# Patient Record
Sex: Female | Born: 1980 | Race: Black or African American | Hispanic: No | Marital: Single | Smoking: Never smoker
Health system: Southern US, Community
[De-identification: ages and names within clinical notes are randomized; demographics above are authoritative.]

## PROBLEM LIST (undated history)

## (undated) ENCOUNTER — Inpatient Hospital Stay: Payer: Self-pay

## (undated) DIAGNOSIS — R519 Headache, unspecified: Secondary | ICD-10-CM

## (undated) DIAGNOSIS — D649 Anemia, unspecified: Secondary | ICD-10-CM

## (undated) DIAGNOSIS — G35 Multiple sclerosis: Secondary | ICD-10-CM

## (undated) DIAGNOSIS — Z98891 History of uterine scar from previous surgery: Secondary | ICD-10-CM

## (undated) HISTORY — DX: History of uterine scar from previous surgery: Z98.891

## (undated) HISTORY — PX: LAPAROSCOPY ABDOMEN DIAGNOSTIC: PRO50

---

## 2005-09-26 ENCOUNTER — Emergency Department: Payer: Self-pay | Admitting: Emergency Medicine

## 2007-04-15 ENCOUNTER — Inpatient Hospital Stay: Payer: Self-pay | Admitting: Obstetrics and Gynecology

## 2008-12-28 ENCOUNTER — Encounter: Payer: Self-pay | Admitting: Obstetrics and Gynecology

## 2009-06-05 DIAGNOSIS — G35 Multiple sclerosis: Secondary | ICD-10-CM

## 2009-06-05 HISTORY — DX: Multiple sclerosis: G35

## 2009-07-02 ENCOUNTER — Ambulatory Visit: Payer: Self-pay | Admitting: Obstetrics and Gynecology

## 2009-07-05 ENCOUNTER — Inpatient Hospital Stay: Payer: Self-pay | Admitting: Obstetrics and Gynecology

## 2009-11-25 ENCOUNTER — Emergency Department: Payer: Self-pay | Admitting: Emergency Medicine

## 2010-08-07 ENCOUNTER — Ambulatory Visit: Payer: Self-pay | Admitting: Neurology

## 2010-08-09 ENCOUNTER — Ambulatory Visit: Payer: Self-pay | Admitting: Neurology

## 2010-09-02 ENCOUNTER — Other Ambulatory Visit: Payer: Self-pay | Admitting: Neurology

## 2012-02-29 ENCOUNTER — Emergency Department: Payer: Self-pay | Admitting: Unknown Physician Specialty

## 2012-02-29 LAB — URINALYSIS, COMPLETE
Bilirubin,UR: NEGATIVE
Blood: NEGATIVE
Glucose,UR: NEGATIVE mg/dL (ref 0–75)
Ketone: NEGATIVE
Leukocyte Esterase: NEGATIVE
Ph: 6 (ref 4.5–8.0)
Protein: NEGATIVE
Specific Gravity: 1.014 (ref 1.003–1.030)
Squamous Epithelial: 9

## 2012-02-29 LAB — CBC WITH DIFFERENTIAL/PLATELET
Basophil %: 0.5 %
Eosinophil #: 0.1 10*3/uL (ref 0.0–0.7)
Eosinophil %: 0.8 %
HGB: 9.9 g/dL — ABNORMAL LOW (ref 12.0–16.0)
Lymphocyte %: 20.4 %
MCHC: 31.4 g/dL — ABNORMAL LOW (ref 32.0–36.0)
Monocyte #: 0.5 x10 3/mm (ref 0.2–0.9)
Monocyte %: 7.2 %
Neutrophil %: 71.1 %
RBC: 4.27 10*6/uL (ref 3.80–5.20)
WBC: 7.1 10*3/uL (ref 3.6–11.0)

## 2012-02-29 LAB — COMPREHENSIVE METABOLIC PANEL
Alkaline Phosphatase: 70 U/L (ref 50–136)
Calcium, Total: 8.4 mg/dL — ABNORMAL LOW (ref 8.5–10.1)
Chloride: 106 mmol/L (ref 98–107)
Co2: 25 mmol/L (ref 21–32)
EGFR (African American): >60
EGFR (Non-African Amer.): >60
Glucose: 98 mg/dL (ref 65–99)
Osmolality: 275 (ref 275–301)
SGOT(AST): 17 U/L (ref 15–37)
SGPT (ALT): 13 U/L (ref 12–78)
Sodium: 139 mmol/L (ref 136–145)

## 2012-02-29 LAB — PREGNANCY, URINE: Pregnancy Test, Urine: NEGATIVE m[IU]/mL

## 2012-02-29 LAB — LIPASE, BLOOD: Lipase: 73 U/L (ref 73–393)

## 2012-05-09 ENCOUNTER — Ambulatory Visit: Payer: Self-pay | Admitting: Unknown Physician Specialty

## 2012-05-09 LAB — URINALYSIS, COMPLETE
Bilirubin,UR: NEGATIVE
Glucose,UR: NEGATIVE mg/dL (ref 0–75)
Ketone: NEGATIVE
Ph: 7 (ref 4.5–8.0)
Protein: NEGATIVE
RBC,UR: 6 /HPF (ref 0–5)
Specific Gravity: 1.025 (ref 1.003–1.030)
Squamous Epithelial: 3
WBC UR: 2 /HPF (ref 0–5)

## 2012-05-09 LAB — HEMOGLOBIN: HGB: 9.1 g/dL — ABNORMAL LOW (ref 12.0–16.0)

## 2012-05-10 ENCOUNTER — Ambulatory Visit: Payer: Self-pay | Admitting: Obstetrics and Gynecology

## 2012-05-13 LAB — PATHOLOGY REPORT

## 2014-09-22 NOTE — Op Note (Signed)
PATIENT NAME:  [name], [name] MR#:  [id number] DATE OF BIRTH:  1981/04/20  DATE OF PROCEDURE:  05/10/2012  PREOPERATIVE DIAGNOSIS: Pelvic pain.   POSTOPERATIVE DIAGNOSES: Pelvic pain, adhesive disease, and right paratubal cyst.   PROCEDURE:  1. Diagnostic laparoscopy.  2. Moderate adhesiolysis.  3. Excision of right paratubal cyst.  SURGEON:  Patty Sermons, M.D.   ANESTHESIA: General endotracheal.   FINDINGS: Adhesions along the anterior bladder flap at the right colonic reflection. Right-sided paratubal cyst. Grossly normal ovaries except for polycystic appearance of the right more so than the left.  Unremarkable posterior cul-de-sac, unremarkable uterus, unremarkable lower liver edge.   ESTIMATED BLOOD LOSS: Minimal.   COMPLICATIONS: None.   SPECIMENS: Right paratubal cyst.   PROCEDURE IN DETAIL: The patient was consented. Preoperative antibiotics were given. She was taken to the operating room and placed in the supine position where anesthesia was initiated. She was then placed in the dorsal lithotomy position using Allen stirrups, prepped and draped in the usual sterile fashion. Cervix was visualized. A single-tooth tenaculum was placed. IUD was in place, therefore did not place any manipulator through the cervix. The bladder was drained of approximately 50 mL of clear urine. Next we turned our attention to the abdomen.   A #11 port was placed infraumbilically under direct visualization. Five ports were placed in bilateral lower quadrants with findings as noted above. After placing the patient in the Trendelenburg position, I began to dissect free the adhesions in the anterior cul-de-sac using the Harmonic scalpel. There was a large omental adhesion in the mid lower abdomen that we dissected free initially. After dissecting the omental adhesions we began to dissect the anterior cul-de-sac adhesions, taking care to keep the dissection superficial and out of the bladder.   The right  paratubal cyst was noted and using the Harmonic was entered with spillage of clear fluid. The sac was then dissected free using blunt and Harmonic dissection. It did not appear that the tubal lumen was impacted.   I then inspected the appendix, which appeared to be unremarkable. There was an adhesion at the right colonic reflection,  which was dissected free with the Harmonic scalpel.   Pressure was lowered to 6 mmHg. Areas were irrigated and seen to be hemostatic. Pressure was brought back up and we put Interceed across the anterior cul-de-sac to attempt to inhibit adhesions recurring. At this point, the procedure was felt to have achieved maximum efficacy. Ports were removed. Pneumoperitoneum was allowed to resolve. Incisions were closed with deep of zero, subcutaneous with 3-0 Vicryl. Steri-Strips and Band-Aids were applied.   All instrument, needle, and sponge counts were correct. The patient tolerated the procedure well. 2 grams of Ancef were given IV preoperatively. I anticipate a routine postoperative course.   ____________________________ Rockey Situ. Amalia Hailey, MD rle:bjt D: 05/10/2012 09:58:45 ET T: 05/10/2012 10:47:02 ET JOB#: [id number]  cc: Audry Pili L. Amalia Hailey, MD, <Dictator> Selmer Dominion MD ELECTRONICALLY SIGNED 05/10/2012 11:36

## 2015-12-15 ENCOUNTER — Ambulatory Visit
Admission: RE | Admit: 2015-12-15 | Discharge: 2015-12-15 | Disposition: A | Discharge status: Home / Self Care | Payer: Managed Care, Other (non HMO) | Source: Ambulatory Visit | Attending: Obstetrics and Gynecology | Admitting: Obstetrics and Gynecology

## 2015-12-15 ENCOUNTER — Other Ambulatory Visit: Payer: Self-pay | Admitting: Obstetrics and Gynecology

## 2015-12-15 DIAGNOSIS — O200 Threatened abortion: Secondary | ICD-10-CM | POA: Insufficient documentation

## 2016-03-28 ENCOUNTER — Other Ambulatory Visit: Payer: Self-pay | Admitting: Obstetrics and Gynecology

## 2016-03-28 DIAGNOSIS — Z369 Encounter for antenatal screening, unspecified: Secondary | ICD-10-CM

## 2016-04-13 ENCOUNTER — Ambulatory Visit (HOSPITAL_BASED_OUTPATIENT_CLINIC_OR_DEPARTMENT_OTHER)
Admission: RE | Admit: 2016-04-13 | Discharge: 2016-04-13 | Disposition: A | Discharge status: Home / Self Care | Payer: Managed Care, Other (non HMO) | Source: Ambulatory Visit | Attending: Maternal & Fetal Medicine | Admitting: Maternal & Fetal Medicine

## 2016-04-13 ENCOUNTER — Ambulatory Visit
Admission: RE | Admit: 2016-04-13 | Discharge: 2016-04-13 | Disposition: A | Discharge status: Home / Self Care | Payer: Managed Care, Other (non HMO) | Source: Ambulatory Visit | Attending: Maternal & Fetal Medicine | Admitting: Maternal & Fetal Medicine

## 2016-04-13 VITALS — BP 134/65 | HR 82 | Temp 98.4°F | Resp 18 | Wt 176.8 lb

## 2016-04-13 DIAGNOSIS — D259 Leiomyoma of uterus, unspecified: Secondary | ICD-10-CM | POA: Insufficient documentation

## 2016-04-13 DIAGNOSIS — Z369 Encounter for antenatal screening, unspecified: Secondary | ICD-10-CM | POA: Diagnosis not present

## 2016-04-13 DIAGNOSIS — Z3A13 13 weeks gestation of pregnancy: Secondary | ICD-10-CM | POA: Insufficient documentation

## 2016-04-13 DIAGNOSIS — O208 Other hemorrhage in early pregnancy: Secondary | ICD-10-CM | POA: Insufficient documentation

## 2016-04-13 HISTORY — DX: Multiple sclerosis: G35

## 2016-04-13 NOTE — Progress Notes (Addendum)
Referring physician:  [facility] Length of Consultation: 30 minutes   [name]  was referred to [facility] for genetic counseling to review prenatal screening and testing options.  This note summarizes the information we discussed.    We offered the following routine screening tests for this pregnancy:  First trimester screening, which includes nuchal translucency ultrasound screen and first trimester maternal serum marker screening.  The nuchal translucency has approximately an 80% detection rate for Down syndrome and can be positive for other chromosome abnormalities as well as congenital heart defects.  When combined with a maternal serum marker screening, the detection rate is up to 90% for Down syndrome and up to 97% for trisomy 18.     Maternal serum marker screening, a blood test that measures pregnancy proteins, can provide risk assessments for Down syndrome, trisomy 18, and open neural tube defects (spina bifida, anencephaly). Because it does not directly examine the fetus, it cannot positively diagnose or rule out these problems.  Targeted ultrasound uses high frequency sound waves to create an image of the developing fetus.  An ultrasound is often recommended as a routine means of evaluating the pregnancy.  It is also used to screen for fetal anatomy problems (for example, a heart defect) that might be suggestive of a chromosomal or other abnormality.   Should these screening tests indicate an increased concern, then the following additional testing options would be offered:  The chorionic villus sampling procedure is available for first trimester chromosome analysis.  This involves the withdrawal of a small amount of chorionic villi (tissue from the developing placenta).  Risk of pregnancy loss is estimated to be approximately 1 in 200 to 1 in 100 (0.5 to 1%).  There is approximately a 1% (1 in 100) chance that the CVS chromosome results will be  unclear.  Chorionic villi cannot be tested for neural tube defects.     Amniocentesis involves the removal of a small amount of amniotic fluid from the sac surrounding the fetus with the use of a thin needle inserted through the maternal abdomen and uterus.  Ultrasound guidance is used throughout the procedure.  Fetal cells from amniotic fluid are directly evaluated and > 99.5% of chromosome problems and > 98% of open neural tube defects can be detected. This procedure is generally performed after the 15th week of pregnancy.  The main risks to this procedure include complications leading to miscarriage in less than 1 in 200 cases (0.5%).  As another option for information if the pregnancy is suspected to be an an increased chance for certain chromosome conditions, we also reviewed the availability of cell free fetal DNA testing from maternal blood to determine whether or not the baby may have either Down syndrome, trisomy 49, or trisomy 31.  This test utilizes a maternal blood sample and DNA sequencing technology to isolate circulating cell free fetal DNA from maternal plasma.  The fetal DNA can then be analyzed for DNA sequences that are derived from the three most common chromosomes involved in aneuploidy, chromosomes 13, 18, and 21.  If the overall amount of DNA is greater than the expected level for any of these chromosomes, aneuploidy is suspected.  While we do not consider it a replacement for invasive testing and karyotype analysis, a negative result from this testing would be reassuring, though not a guarantee of a normal chromosome complement for the baby.  An abnormal result is certainly suggestive of an abnormal chromosome complement,  though we would still recommend CVS or amniocentesis to confirm any findings from this testing.   Cystic Fibrosis and Spinal Muscular Atrophy (SMA) screening were also discussed with the patient. Both conditions are recessive, which means that both parents must be  carriers in order to have a child with the disease.  Cystic fibrosis (CF) is one of the most common genetic conditions in persons of Caucasian ancestry.  This condition occurs in approximately 1 in 2,500 Caucasian persons and results in thickened secretions in the lungs, digestive, and reproductive systems.  For a baby to be at risk for having CF, both of the parents must be carriers for this condition.  Approximately 1 in 2 Caucasian persons is a carrier for CF.  Current carrier testing looks for the most common mutations in the gene for CF and can detect approximately 90% of carriers in the Caucasian population.  This means that the carrier screening can greatly reduce, but cannot eliminate, the chance for an individual to have a child with CF.  If an individual is found to be a carrier for CF, then carrier testing would be available for the partner. As part of [facility] newborn screening profile, all babies born in the state of [place] will have a two-tier screening process.  Specimens are first tested to determine the concentration of immunoreactive trypsinogen (IRT).  The top 5% of specimens with the highest IRT values then undergo DNA testing using a panel of over 40 common CF mutations. SMA is a neurodegenerative disorder that leads to atrophy of skeletal muscle and overall weakness.  This condition is also more prevalent in the Caucasian population, with 1 in 40-1 in 60 persons being a carrier and 1 in 6,000-1 in 10,000 children being affected.  There are multiple forms of the disease, with some causing death in infancy to other forms with survival into adulthood.  The genetics of SMA is complex, but carrier screening can detect up to 95% of carriers in the Caucasian population.  Similar to CF, a negative result can greatly reduce, but cannot eliminate, the chance to have a child with SMA.  We obtained a detailed family history and pregnancy history.  The family history was reported to be  unremarkable for birth defects, mental retardation, recurrent pregnancy loss or known chromosome abnormalities.  Ms. Petrini stated that this is her fourth pregnancy.  She and her husband have two healthy children and had one early miscarriage.  She reported no complications or exposures in this pregnancy that would be expected to increase the risk for birth defects.  She was diagnosed with MS in 2011 but is currently asymptomatic and not on any medications.  A review of her prenatal labs revealed a low MCV (72) and a normal hemoglobin fractionation (AA).  We discussed that a low MCV may be the result of low iron or an inherited hemoglobinopathy including sickle cell trait or thalassemia.  She was certain that iron studies were performed at at [facility], as she was told she needed to take an iron supplement.  However, we cannot locate those results in EPIC.  The patient will speak with her OB tomorrow and if that testing has been done, she will get a copy sent to our office.  If iron studies have not been completed, we would recommend those to assess for iron deficiency.  If Ms. Anagnostopoulos is NOT iron deficient, then her low MCV could be the result of alpha or beta thalassemia (most likely alpha  given her normal A2) and testing of the father of the baby should be considered.  We are happy to speak with her further if desired.  Lastly, the father of the baby, Joneen Caraway, is 33 years old.  Advanced paternal age is known to be associated with an increased chance for several fetal health conditions.  There is known to be an increase in single gene abnormalities in the children of men who are over age 69-50, though the specific age varies in different studies.  This chance is estimated to be no greater than 2%. These mutations may result in birth defects or genetic syndromes which may show differences on ultrasound in the second trimester.  Therefore, we recommend a detailed anatomy ultrasound at approximately [redacted]  weeks gestation.  We are happy to schedule this at [facility] if desired.  A normal ultrasound cannot rule out these disorders. Advanced paternal age has also been associated with other conditions including autism spectrum disorders, schizophrenia and childhood acute lymphoblastic leukemia.  It is important to be aware of these associations, though no clinical testing is available for these conditions at this time.  After consideration of the options, Ms. Dutch elected to proceed with first trimester screening and to decline CF and SMA carrier testing.  An ultrasound was performed at the time of the visit.  The gestational age was consistent with  30 weeks.  Fetal anatomy could not be assessed due to early gestational age.  Please refer to the ultrasound report for details of that study.  Ms. Mauel was encouraged to call with questions or concerns.  We can be contacted at [phone].    Wilburt Finlay, MS, CGC  I was immediately available and supervising. Erasmo Score, MD [facility]

## 2016-04-17 ENCOUNTER — Telehealth: Payer: Self-pay | Admitting: Obstetrics and Gynecology

## 2016-04-17 NOTE — Telephone Encounter (Signed)
  [name] elected to undergo First Trimester screening on 04/13/2016.  To review, first trimester screening, includes nuchal translucency ultrasound screen and/or first trimester maternal serum marker screening.  The nuchal translucency has approximately an 80% detection rate for Down syndrome and can be positive for other chromosome abnormalities as well as heart defects.  When combined with a maternal serum marker screening, the detection rate is up to 90% for Down syndrome and up to 97% for trisomy 13 and 18.     The results of the First Trimester Nuchal Translucency and Biochemical Screening were within normal range.  The risk for Down syndrome is now estimated to be less than 1 in 10,000.  The risk for Trisomy 13/18 is also estimated to be less than 1 in 10,000.  Should more definitive information be desired, we would offer amniocentesis.  Because we do not yet know the effectiveness of combined first and second trimester screening, we do not recommend a maternal serum screen to assess the chance for chromosome conditions.  However, if screening for neural tube defects is desired, maternal serum screening for AFP only can be performed between 15 and [redacted] weeks gestation.    We spoke with the patient again about her prenatal labs.  Because there is no evidence of prior iron studies, we would recommend formal iron studies to determine if [name] is iron deficient.  If those are normal, then it is likely that she is a carrier for an inherited anemia (likely alpha thalassemia) and testing of the father of the baby would be warranted.  Please let us know if we can be of assistance with this assessment.  Wilburt Finlay, MS, CGC

## 2016-06-06 ENCOUNTER — Ambulatory Visit: Payer: Managed Care, Other (non HMO) | Admitting: Oncology

## 2016-06-11 ENCOUNTER — Observation Stay
Admission: RE | Admit: 2016-06-11 | Discharge: 2016-06-11 | Disposition: A | Discharge status: Home / Self Care | Payer: Managed Care, Other (non HMO) | Attending: Obstetrics and Gynecology | Admitting: Obstetrics and Gynecology

## 2016-06-11 DIAGNOSIS — Z3A21 21 weeks gestation of pregnancy: Secondary | ICD-10-CM | POA: Diagnosis not present

## 2016-06-11 DIAGNOSIS — O26892 Other specified pregnancy related conditions, second trimester: Principal | ICD-10-CM | POA: Insufficient documentation

## 2016-06-11 DIAGNOSIS — R1030 Lower abdominal pain, unspecified: Secondary | ICD-10-CM | POA: Insufficient documentation

## 2016-06-11 DIAGNOSIS — G35 Multiple sclerosis: Secondary | ICD-10-CM | POA: Insufficient documentation

## 2016-06-11 LAB — URINALYSIS, ROUTINE W REFLEX MICROSCOPIC
Bilirubin Urine: NEGATIVE
Glucose, UA: NEGATIVE mg/dL
HGB URINE DIPSTICK: NEGATIVE
Ketones, ur: NEGATIVE mg/dL
NITRITE: NEGATIVE
PROTEIN: NEGATIVE mg/dL
SPECIFIC GRAVITY, URINE: 1.018 (ref 1.005–1.030)
pH: 6 (ref 5.0–8.0)

## 2016-06-11 NOTE — OB Triage Note (Signed)
Patient reports with stabbing pain, that feels like sharp stabbing pain located in lower abdomen mostly with movement. Pain has been there for several days to a week  off and on. Pain has gotten worse over course of the week. No problems with urination, no discharge, no blood. Some back pain today left side, history of bladder infections.

## 2016-06-11 NOTE — Progress Notes (Addendum)
[name] is a 36 y.o. G4P0013 at [redacted]w[redacted]d by LMP of 01/13/16 with EDD of 10/19/16 C/W [place] at 6 weeks  admitted for Observation due to 1 week hx of lower abdominal pain and Lt flank/back discomfort. Pt states the pain is intermittant and sharp, stabbing  and is mostly on the Rt lower area closer to the groin and also the Lt. Also having some discomfort in the Lt flank. Worried that there may be problem. Denies any VB, LOF, decreased fetal movement or UC's. No urgency, no frequency or burning with urination. No abnormal vag itching, vag dc or odor present. Pain comes and goes.   Subjective: " I am worried and want to get checked"  Objective: LMP 01/13/2016  Past Medical History:  Diagnosis Date  . MS (multiple sclerosis) ([place]) 2011   Past Surgical History:  Procedure Laterality Date  . CESAREAN SECTION    . EXPLORATORY LAPAROTOMY    No family history on file.  Social History   Social History  . Marital status: Single    Spouse name: N/A  . Number of children: N/A  . Years of education: N/A   Occupational History  . Not on file.   Social History Main Topics  . Smoking status: Never Smoker  . Smokeless tobacco: Never Used  . Alcohol use No  . Drug use: No  . Sexual activity: Yes   Other Topics Concern  . Not on file   Social History Narrative  . No narrative on file   FHT:  Doppler per RN and WNL UC:   None SVE:    Not examined as pt is not in labor  Vitals:   06/11/16 1107  Temp: 97.6 F (36.4 C)   Labs: Lab Results  Component Value Date   WBC 7.1 02/29/2012   HGB 9.1 (L) 05/09/2012   HCT 31.6 (L) 02/29/2012   MCV 74 (L) 02/29/2012   PLT 381 02/29/2012    Assessment / Plan: A: IUP at 21 weeks 2. Abdominal/Suprapubic pain/Lt flank pain Labor: NOne  Preeclampsia:None Fetal Wellbeing:  Normal FHR P: Urine to lab for microcopic 2. Monitor vital signs, FHR and evaluate for pain. 3. Continue to observe. Pt is probably having Round Ligament pain and  discussed with pt. Will check to make sure that this is what she is having. Urine pending.    Catheryn Bacon 06/11/2016, 11:04 AM

## 2016-06-11 NOTE — Discharge Instructions (Signed)
Round Ligament Pain Introduction The round ligament is a cord of muscle and tissue that helps to support the uterus. It can become a source of pain during pregnancy if it becomes stretched or twisted as the baby grows. The pain usually begins in the second trimester of pregnancy, and it can come and go until the baby is delivered. It is not a serious problem, and it does not cause harm to the baby. Round ligament pain is usually a short, sharp, and pinching pain, but it can also be a dull, lingering, and aching pain. The pain is felt in the lower side of the abdomen or in the groin. It usually starts deep in the groin and moves up to the outside of the hip area. Pain can occur with:  A sudden change in position.  Rolling over in bed.  Coughing or sneezing.  Physical activity. Follow these instructions at home: Watch your condition for any changes. Take these steps to help with your pain:  When the pain starts, relax. Then try:  Sitting down.  Flexing your knees up to your abdomen.  Lying on your side with one pillow under your abdomen and another pillow between your legs.  Sitting in a warm bath for 15-20 minutes or until the pain goes away.  Take over-the-counter and prescription medicines only as told by your health care provider.  Move slowly when you sit and stand.  Avoid long walks if they cause pain.  Stop or lessen your physical activities if they cause pain. Contact a health care provider if:  Your pain does not go away with treatment.  You feel pain in your back that you did not have before.  Your medicine is not helping. Get help right away if:  You develop a fever or chills.  You develop uterine contractions.  You develop vaginal bleeding.  You develop nausea or vomiting.  You develop diarrhea.  You have pain when you urinate. This information is not intended to replace advice given to you by your health care provider. Make sure you discuss any questions  you have with your health care provider. Document Released: 02/29/2008 Document Revised: 10/28/2015 Document Reviewed: 07/29/2014  2017 Elsevier  

## 2016-06-11 NOTE — Discharge Summary (Signed)
Obstetric Discharge Summary Reason for Admission: observation/evaluation Prenatal Procedures: ultrasound Intrapartum Procedures: GBS prophylaxis and none yet Postpartum Procedures: not delivered yet Complications-Operative and Postpartum: none HGB  Date Value Ref Range Status  05/09/2012 9.1 (L) 12.0 - 16.0 g/dL Final   HCT  Date Value Ref Range Status  02/29/2012 31.6 (L) 35.0 - 47.0 % Final    Physical Exam:  General: alert, cooperative and appears stated age  Heart: S1S2, RRR, NO M/R/G. Lungs: CTA bilat, no W/R/R. Lochia:No VB noted Uterus: 21 weeks size DVT Evaluation: Neg [place]  Discharge Diagnoses: IUP at 21 weeks  Urine is neg for UTI. Will dc home and fu in office in 1-2 weeks Discharge Information: Date: 06/11/2016 Activity: Rest, no sex x 48 hours Diet:Reg Medications: PNV, Tylenol prn  Condition: Stable  Instructions: Rest. Prop legs up. Reassure round ligament pain is normal  Discharge to: Home FU at next scheduled appt.   Newborn Data: This patient has no babies on file.   Catheryn Bacon 06/11/2016, 11:12 AM

## 2016-06-11 NOTE — OB Triage Note (Signed)
Glenis Smoker CNM at bedside 1130 discussed plan of care and discharge instructions. Patient and family verbalized understanding and will return to office Jan 18 for regular follow up.

## 2016-06-18 DIAGNOSIS — D509 Iron deficiency anemia, unspecified: Secondary | ICD-10-CM | POA: Insufficient documentation

## 2016-06-18 DIAGNOSIS — O99013 Anemia complicating pregnancy, third trimester: Secondary | ICD-10-CM

## 2016-06-18 NOTE — Progress Notes (Signed)
[place]  [phone] [phone]  ID: Sherian Maroon OB: 15-May-1981  MR#: [id number]  [id number]  Patient Care Team: Catheryn Bacon, CNM as PCP - General (Obstetrics and Gynecology)  CHIEF COMPLAINT: Maternal iron deficiency anemia complicating pregnancy in the third trimester.  INTERVAL HISTORY: Patient is a 36 year old female who was noted to have a declining hemoglobin during pregnancy. This is patient's third pregnancy and she states she has been anemic for her to previous heart disease. She currently feels well and is asymptomatic. She has no neurologic complaints. She denies any recent fevers or illnesses. She is gaining weight appropriately. She has no chest pain or shortness of breath. She denies any nausea, vomiting, constipation, or diarrhea. She denies any melena or hematochezia. She has no urinary complaints. Patient feels at her baseline and offers no specific complaints today.  REVIEW OF SYSTEMS:   Review of Systems  Constitutional: Negative.  Negative for fever, malaise/fatigue and weight loss.  Respiratory: Negative.  Negative for cough and shortness of breath.   Cardiovascular: Negative.  Negative for chest pain and leg swelling.  Gastrointestinal: Negative.  Negative for abdominal pain, blood in stool and melena.  Genitourinary: Negative.   Musculoskeletal: Negative.   Neurological: Negative.  Negative for weakness.  Psychiatric/Behavioral: Negative.  The patient is not nervous/anxious.     As per HPI. Otherwise, a complete review of systems is negative.  PAST MEDICAL HISTORY: Past Medical History:  Diagnosis Date  . H/O: C-section 2008 and 2011  . MS (multiple sclerosis) ([place]) 2011    PAST SURGICAL HISTORY: Past Surgical History:  Procedure Laterality Date  . CESAREAN SECTION    . LAPAROSCOPY ABDOMEN DIAGNOSTIC      FAMILY HISTORY: Reviewed and unchanged. No reported history of malignancy or chronic  disease.  ADVANCED DIRECTIVES (Y/N):  N  HEALTH MAINTENANCE: Social History  Substance Use Topics  . Smoking status: Never Smoker  . Smokeless tobacco: Never Used  . Alcohol use No     Colonoscopy:  PAP:  Bone density:  Lipid panel:  No Known Allergies  Current Outpatient Prescriptions  Medication Sig Dispense Refill  . ferrous sulfate 325 (65 FE) MG tablet Take 325 mg by mouth daily with breakfast.    . Prenatal Vit-Fe Fumarate-FA (MULTIVITAMIN-PRENATAL) 27-0.8 MG TABS tablet Take 1 tablet by mouth daily at 12 noon.     No current facility-administered medications for this visit.     OBJECTIVE: Vitals:   06/19/16 1542  BP: 122/70  Pulse: 90  Resp: 18  Temp: 97 F (36.1 C)     Body mass index is 28.83 kg/m.    ECOG FS:0 - Asymptomatic  General: Well-developed, well-nourished, no acute distress. Eyes: Pink conjunctiva, anicteric sclera. HEENT: Normocephalic, moist mucous membranes, clear oropharnyx. Lungs: Clear to auscultation bilaterally. Heart: Regular rate and rhythm. No rubs, murmurs, or gallops. Abdomen: Appears appropriate for gestational age. Musculoskeletal: No edema, cyanosis, or clubbing. Neuro: Alert, answering all questions appropriately. Cranial nerves grossly intact. Skin: No rashes or petechiae noted. Psych: Normal affect. Lymphatics: No cervical, calvicular, axillary or inguinal LAD.   LAB RESULTS:  Lab Results  Component Value Date   NA 139 02/29/2012   K 4.0 02/29/2012   CL 106 02/29/2012   CO2 25 02/29/2012   GLUCOSE 98 02/29/2012   BUN 5 (L) 02/29/2012   CREATININE 0.62 02/29/2012   CALCIUM 8.4 (L) 02/29/2012   PROT 7.4 02/29/2012   ALBUMIN 3.0 (L) 02/29/2012   AST 17  02/29/2012   ALT 13 02/29/2012   ALKPHOS 70 02/29/2012   BILITOT 0.2 02/29/2012   GFRNONAA >60 02/29/2012   GFRAA >60 02/29/2012    Lab Results  Component Value Date   WBC 7.3 06/19/2016   NEUTROABS 5.0 02/29/2012   HGB 11.2 (L) 06/19/2016   HCT 34.4 (L)  06/19/2016   MCV 81.5 06/19/2016   PLT 255 06/19/2016     STUDIES: No results found.  ASSESSMENT: Maternal iron deficiency anemia complicating pregnancy in the third trimester.  PLAN:    1. Maternal iron deficiency anemia complicating pregnancy in the third trimester:  Patient's hemoglobin has improved since her last blood drawn and is now 11.2. Iron stores and the remainder of her blood work is pending at time of dictation. No intervention is needed at this time. Patient has been instructed to continue oral iron supplementation as directed. Return to clinic at the end of February for further evaluation and consideration of IV iron. 2. Pregnancy: Patient is having a scheduled C-section on Oct 12, 2016.  Patient expressed understanding and was in agreement with this plan. She also understands that She can call clinic at any time with any questions, concerns, or complaints.    Lloyd Huger, MD   06/19/2016 5:24 PM

## 2016-06-19 ENCOUNTER — Inpatient Hospital Stay: Payer: Managed Care, Other (non HMO)

## 2016-06-19 ENCOUNTER — Encounter: Payer: Self-pay | Admitting: Oncology

## 2016-06-19 ENCOUNTER — Inpatient Hospital Stay: Payer: Managed Care, Other (non HMO) | Attending: Oncology | Admitting: Oncology

## 2016-06-19 DIAGNOSIS — Z3A00 Weeks of gestation of pregnancy not specified: Secondary | ICD-10-CM | POA: Insufficient documentation

## 2016-06-19 DIAGNOSIS — O99013 Anemia complicating pregnancy, third trimester: Principal | ICD-10-CM

## 2016-06-19 DIAGNOSIS — Z79899 Other long term (current) drug therapy: Secondary | ICD-10-CM

## 2016-06-19 DIAGNOSIS — G35 Multiple sclerosis: Secondary | ICD-10-CM | POA: Diagnosis not present

## 2016-06-19 DIAGNOSIS — D509 Iron deficiency anemia, unspecified: Secondary | ICD-10-CM | POA: Diagnosis not present

## 2016-06-19 LAB — IRON AND TIBC
IRON: 38 ug/dL (ref 28–170)
Saturation Ratios: 9 % — ABNORMAL LOW (ref 10.4–31.8)
TIBC: 445 ug/dL (ref 250–450)
UIBC: 407 ug/dL

## 2016-06-19 LAB — VITAMIN B12: Vitamin B-12: 165 pg/mL — ABNORMAL LOW (ref 180–914)

## 2016-06-19 LAB — CBC
HEMATOCRIT: 34.4 % — AB (ref 35.0–47.0)
HEMOGLOBIN: 11.2 g/dL — AB (ref 12.0–16.0)
MCH: 26.6 pg (ref 26.0–34.0)
MCHC: 32.6 g/dL (ref 32.0–36.0)
MCV: 81.5 fL (ref 80.0–100.0)
Platelets: 255 10*3/uL (ref 150–440)
RBC: 4.22 MIL/uL (ref 3.80–5.20)
RDW: 18.7 % — ABNORMAL HIGH (ref 11.5–14.5)
WBC: 7.3 10*3/uL (ref 3.6–11.0)

## 2016-06-19 LAB — LACTATE DEHYDROGENASE: LDH: 133 U/L (ref 98–192)

## 2016-06-19 LAB — FOLATE: Folate: 20.4 ng/mL (ref >5.9)

## 2016-06-19 LAB — DAT, POLYSPECIFIC AHG (ARMC ONLY): Polyspecific AHG test: NEGATIVE

## 2016-06-19 LAB — FERRITIN: Ferritin: 5 ng/mL — ABNORMAL LOW (ref 11–307)

## 2016-06-19 NOTE — Progress Notes (Signed)
Patient here as new evaluation regarding anemia. Referred by Dr. Haywood Lasso Ward. Patient is [redacted] weeks pregnant.

## 2016-06-20 LAB — HAPTOGLOBIN: HAPTOGLOBIN: 168 mg/dL (ref 34–200)

## 2016-07-26 ENCOUNTER — Other Ambulatory Visit: Payer: Self-pay

## 2016-07-26 DIAGNOSIS — O99013 Anemia complicating pregnancy, third trimester: Principal | ICD-10-CM

## 2016-07-26 DIAGNOSIS — D509 Iron deficiency anemia, unspecified: Secondary | ICD-10-CM

## 2016-07-26 NOTE — Progress Notes (Signed)
[place]  [phone] [phone]  ID: Sherian Maroon OB: 07-May-1981  MR#: [id number]  [id number]  Patient Care Team: Catheryn Bacon, CNM as PCP - General (Obstetrics and Gynecology)  CHIEF COMPLAINT: Maternal iron deficiency anemia complicating pregnancy in the third trimester.  INTERVAL HISTORY: Patient returns to clinic today for repeat laboratory work, further evaluation and consideration of IV iron. She currently feels well and is asymptomatic. She has no neurologic complaints. She denies any recent fevers or illnesses. She is gaining weight appropriately. She has no chest pain or shortness of breath. She denies any nausea, vomiting, constipation, or diarrhea. She denies any melena or hematochezia. She has no urinary complaints. Patient feels at her baseline and offers no specific complaints today.  REVIEW OF SYSTEMS:   Review of Systems  Constitutional: Negative.  Negative for fever, malaise/fatigue and weight loss.  Respiratory: Negative.  Negative for cough and shortness of breath.   Cardiovascular: Negative.  Negative for chest pain and leg swelling.  Gastrointestinal: Negative.  Negative for abdominal pain, blood in stool and melena.  Genitourinary: Negative.   Musculoskeletal: Negative.   Neurological: Negative.  Negative for weakness.  Psychiatric/Behavioral: Negative.  The patient is not nervous/anxious.     As per HPI. Otherwise, a complete review of systems is negative.  PAST MEDICAL HISTORY: Past Medical History:  Diagnosis Date  . H/O: C-section 2008 and 2011  . MS (multiple sclerosis) ([place]) 2011    PAST SURGICAL HISTORY: Past Surgical History:  Procedure Laterality Date  . CESAREAN SECTION    . LAPAROSCOPY ABDOMEN DIAGNOSTIC      FAMILY HISTORY: Reviewed and unchanged. No reported history of malignancy or chronic disease.  ADVANCED DIRECTIVES (Y/N):  N  HEALTH MAINTENANCE: Social History  Substance Use  Topics  . Smoking status: Never Smoker  . Smokeless tobacco: Never Used  . Alcohol use No     Colonoscopy:  PAP:  Bone density:  Lipid panel:  No Known Allergies  Current Outpatient Prescriptions  Medication Sig Dispense Refill  . ferrous sulfate 325 (65 FE) MG tablet Take 325 mg by mouth daily with breakfast.    . Prenatal Vit-Fe Fumarate-FA (MULTIVITAMIN-PRENATAL) 27-0.8 MG TABS tablet Take 1 tablet by mouth daily at 12 noon.     No current facility-administered medications for this visit.     OBJECTIVE: Vitals:   07/27/16 1404  BP: 116/69  Pulse: 98  Temp: 98.3 F (36.8 C)     Body mass index is 29.47 kg/m.    ECOG FS:0 - Asymptomatic  General: Well-developed, well-nourished, no acute distress. Eyes: Pink conjunctiva, anicteric sclera. Lungs: Clear to auscultation bilaterally. Heart: Regular rate and rhythm. No rubs, murmurs, or gallops. Abdomen: Appears appropriate for gestational age. Musculoskeletal: No edema, cyanosis, or clubbing. Neuro: Alert, answering all questions appropriately. Cranial nerves grossly intact. Skin: No rashes or petechiae noted. Psych: Normal affect.   LAB RESULTS:  Lab Results  Component Value Date   NA 139 02/29/2012   K 4.0 02/29/2012   CL 106 02/29/2012   CO2 25 02/29/2012   GLUCOSE 98 02/29/2012   BUN 5 (L) 02/29/2012   CREATININE 0.62 02/29/2012   CALCIUM 8.4 (L) 02/29/2012   PROT 7.4 02/29/2012   ALBUMIN 3.0 (L) 02/29/2012   AST 17 02/29/2012   ALT 13 02/29/2012   ALKPHOS 70 02/29/2012   BILITOT 0.2 02/29/2012   GFRNONAA >60 02/29/2012   GFRAA >60 02/29/2012    Lab Results  Component Value Date  WBC 7.3 06/19/2016   NEUTROABS 5.0 02/29/2012   HGB 11.2 (L) 06/19/2016   HCT 34.4 (L) 06/19/2016   MCV 81.5 06/19/2016   PLT 255 06/19/2016   Lab Results  Component Value Date   IRON 31 07/27/2016   TIBC 465 (H) 07/27/2016   IRONPCTSAT 7 (L) 07/27/2016   Lab Results  Component Value Date   FERRITIN 4 (L)  07/27/2016     STUDIES: No results found.  ASSESSMENT: Maternal iron deficiency anemia complicating pregnancy in the third trimester.  PLAN:    1. Maternal iron deficiency anemia complicating pregnancy in the third trimester: Patient's hemoglobin has trended down is now 9.8 from an outside physician's office. Her iron stores are significantly decreased. Previously, the remainder of her blood work was either negative or within normal limits. Proceed with 510mg  IV Feraheme today.  Return to clinic in 1 week for a second infusion. Patient has been instructed to continue oral iron supplementation as directed. Return to clinic in 2 months just prior to her due date for further evaluation consideration of IV iron. 2. Pregnancy: Patient is having a scheduled C-section on Oct 12, 2016.  Patient expressed understanding and was in agreement with this plan. She also understands that She can call clinic at any time with any questions, concerns, or complaints.    Lloyd Huger, MD   07/31/2016 9:11 AM

## 2016-07-27 ENCOUNTER — Inpatient Hospital Stay (HOSPITAL_BASED_OUTPATIENT_CLINIC_OR_DEPARTMENT_OTHER): Payer: Managed Care, Other (non HMO) | Admitting: Oncology

## 2016-07-27 ENCOUNTER — Inpatient Hospital Stay: Payer: Managed Care, Other (non HMO) | Attending: Oncology

## 2016-07-27 ENCOUNTER — Inpatient Hospital Stay: Payer: Managed Care, Other (non HMO)

## 2016-07-27 VITALS — BP 116/69 | HR 98 | Temp 98.3°F | Wt 188.2 lb

## 2016-07-27 DIAGNOSIS — O99013 Anemia complicating pregnancy, third trimester: Principal | ICD-10-CM

## 2016-07-27 DIAGNOSIS — D509 Iron deficiency anemia, unspecified: Secondary | ICD-10-CM

## 2016-07-27 DIAGNOSIS — Z79899 Other long term (current) drug therapy: Secondary | ICD-10-CM | POA: Diagnosis not present

## 2016-07-27 DIAGNOSIS — G35 Multiple sclerosis: Secondary | ICD-10-CM | POA: Diagnosis not present

## 2016-07-27 LAB — IRON AND TIBC
Iron: 31 ug/dL (ref 28–170)
Saturation Ratios: 7 % — ABNORMAL LOW (ref 10.4–31.8)
TIBC: 465 ug/dL — ABNORMAL HIGH (ref 250–450)
UIBC: 434 ug/dL

## 2016-07-27 LAB — FERRITIN: FERRITIN: 4 ng/mL — AB (ref 11–307)

## 2016-07-27 MED ORDER — SODIUM CHLORIDE 0.9 % IV SOLN
Freq: Once | INTRAVENOUS | Status: AC
  Administered 2016-07-27: 15:00:00 via INTRAVENOUS
  Filled 2016-07-27: qty 1000

## 2016-07-27 MED ORDER — SODIUM CHLORIDE 0.9 % IV SOLN
510.0000 mg | Freq: Once | INTRAVENOUS | Status: AC
  Administered 2016-07-27: 510 mg via INTRAVENOUS
  Filled 2016-07-27: qty 17

## 2016-07-27 NOTE — Progress Notes (Signed)
Patient here today for follow up.  Patient states no new concerns today  

## 2016-08-03 ENCOUNTER — Inpatient Hospital Stay: Payer: Managed Care, Other (non HMO) | Attending: Oncology

## 2016-08-03 VITALS — BP 98/64 | HR 84 | Temp 97.2°F | Resp 20

## 2016-08-03 DIAGNOSIS — O99013 Anemia complicating pregnancy, third trimester: Secondary | ICD-10-CM | POA: Insufficient documentation

## 2016-08-03 DIAGNOSIS — D509 Iron deficiency anemia, unspecified: Secondary | ICD-10-CM | POA: Diagnosis not present

## 2016-08-03 DIAGNOSIS — G35 Multiple sclerosis: Secondary | ICD-10-CM | POA: Diagnosis not present

## 2016-08-03 DIAGNOSIS — Z79899 Other long term (current) drug therapy: Secondary | ICD-10-CM | POA: Insufficient documentation

## 2016-08-03 MED ORDER — SODIUM CHLORIDE 0.9 % IV SOLN
Freq: Once | INTRAVENOUS | Status: AC
  Administered 2016-08-03: 15:00:00 via INTRAVENOUS
  Filled 2016-08-03: qty 1000

## 2016-08-03 MED ORDER — SODIUM CHLORIDE 0.9 % IV SOLN
510.0000 mg | Freq: Once | INTRAVENOUS | Status: AC
  Administered 2016-08-03: 510 mg via INTRAVENOUS
  Filled 2016-08-03: qty 17

## 2016-08-31 ENCOUNTER — Ambulatory Visit: Payer: Managed Care, Other (non HMO)

## 2016-09-06 ENCOUNTER — Encounter: Payer: Self-pay | Admitting: *Deleted

## 2016-09-06 ENCOUNTER — Observation Stay
Admission: EM | Admit: 2016-09-06 | Discharge: 2016-09-06 | Disposition: A | Discharge status: Home / Self Care | Payer: Managed Care, Other (non HMO) | Attending: Obstetrics and Gynecology | Admitting: Obstetrics and Gynecology

## 2016-09-06 DIAGNOSIS — G35 Multiple sclerosis: Secondary | ICD-10-CM | POA: Diagnosis not present

## 2016-09-06 DIAGNOSIS — Z3A33 33 weeks gestation of pregnancy: Secondary | ICD-10-CM | POA: Insufficient documentation

## 2016-09-06 DIAGNOSIS — O471 False labor at or after 37 completed weeks of gestation: Principal | ICD-10-CM | POA: Insufficient documentation

## 2016-09-06 DIAGNOSIS — O99353 Diseases of the nervous system complicating pregnancy, third trimester: Secondary | ICD-10-CM | POA: Insufficient documentation

## 2016-09-06 DIAGNOSIS — O479 False labor, unspecified: Secondary | ICD-10-CM | POA: Diagnosis present

## 2016-09-06 NOTE — Discharge Instructions (Signed)
Drink plenty of fluids, get plenty of rest

## 2016-09-06 NOTE — Final Progress Note (Signed)
Patient ID: [name], female   DOB: March 30, 1981, 36 y.o.   MRN: [id number] CC: 10/22/13 Patient's last menstrual period was 01/13/2016. Estimated Date of Delivery: 10/19/16. This is a planned pregnancy. C/W [place] at 6 weeks. PNC at [facility] significant for previous LTCS and today having "UC's becoming more uncomfortable this am". No LOF, NO VB or decreased FM.   ALLERGIES:  Allergies as of 03/24/2016  . (No Known Allergies)   MEDS:  Current Outpatient Prescriptions on File Prior to Visit  Medication Sig Dispense Refill  . PNV 112-iron-[id number]-[id number]-dha-epa 3.33 mg iron- 0.33 mg Chew Take by mouth once daily.   No current facility-administered medications on file prior to visit.   Past Medical History:  . Multiple sclerosis (CMS-HCC)   Past Surgical History:  Past Surgical History:  Procedure Laterality Date  . CESAREAN SECTION  2 LTCS Laparoscopic surgery for pain  Family History  Problem Relation Age of Onset  . Hypertension Mother  . Diabetes mellitus Mother  . Cancer Sister   Social History: reports that she has never smoked. She has never used smokeless tobacco. She reports that she does not drink alcohol or use illicit drugs. OB/GYN History:  OB History  Gravida Para Term Preterm AB Living  3 2 2  0 0 2  SAB TAB Ectopic Multiple Live Births  0 0 0 0 2   # Outcome Date GA Lbr Len/2nd Weight Sex Delivery Anes PTL Lv  3 Current  2 Term 07/05/09 2.863 kg (6 lb 5 oz) F CS-LTranv LIV  1 Term 04/15/07 [redacted]w[redacted]d 3.175 kg (7 lb) M CS-LTranv LIV  Complications: Failure to Progress in First Stage   GENETIC HX: NO Down syndrome, No Trisomies or spinal bifida on either side of family. No twin or triplet history.36 yo black female FOB with  ROS: General: No weight loss or weight gain, fatigue or fevers. HEENT: No change in vision, double vision or loss of hearing. No nosebleeds, difficulty swallowing, sore throat or any other complaints.  HEART: No CP,palpatations, swelling in the  feet or legs or difficulty breathing while lying flat. LUNGS: No SOB, Cough, Congestion or wheezing. GASTROINTESTINAL: Nausea, vomiting, diarrhea, constipation, heartburn or stomach pain. GENITOURINARY: No urgency, frequency or dysuria. MUSCULOSKELETAL: No joint pain, weakness, cramping or back pain. NEUROLOGIC: No frequent HA's, numbness, blackouts or dizziness. PSYCHIATRIC: No anxiety, panic or depression or increased stress or suicidal ideation. ENDOCRINE: No heat or cold intolerance, excessive thirst or hunger. HEMATOLOGIC/LYMPH: No easy bruising or swollen lymph nodes. GYN: No vaginal bleeding, cramping or vaginal odor. +UC's today.   EXAM: VITALS:  Vitals:   09/06/16 1001  BP: 112/66  Temp: 98.5 F (36.9 C)   Body mass index is 28.19 kg/(m^2). UC: occas Uc noted but no regular pattern Cx: Closed/long/vtx-3 NST reactive with 2 accels 15 x 15 BPM noted, occas variable to 120 with no abnormal pattern, Cat 1 A: 1. IUP at 33 6/7 weeks. 2. RCS x 2 3. Multiple Sclerosis  P: 1. NST reactive with 2 accels 15 x 15 BPM 2. Labor ruled out and pt to be dc'd home to rest today 3. Monitor FKC's. 4. Hydration with liquids today. 5. FU as scheduled. Perrin Smack, CNM, MSN, FNP

## 2016-09-06 NOTE — Discharge Summary (Signed)
Pt d/c'd home via wheelchair in stable condition. Not contracting upon d/c. Given education on preterm labor and fetal kick counts. Verbalized understanding. Given work modification note.

## 2016-09-06 NOTE — OB Triage Note (Addendum)
G3P2 Presents at [redacted]w[redacted]d w/ c/o ctx since last night. Became intense in the night, but have calmed down this morning. No bleeding, or LOF. Feeling fetal movement as normal. Has had a sinus infection for a week and not been eating and drinking as much as normal.

## 2016-09-07 ENCOUNTER — Other Ambulatory Visit: Payer: Managed Care, Other (non HMO)

## 2016-09-20 NOTE — Progress Notes (Signed)
[place]  [phone] [phone]  ID: [name] OB: Aug 09, 1980  MR#: [id number]  YYQ#:[id number]  Patient Care Team: Catheryn Bacon, CNM as PCP - General (Obstetrics and Gynecology)  CHIEF COMPLAINT: Maternal iron deficiency anemia complicating pregnancy in the third trimester.  INTERVAL HISTORY: Patient returns to clinic today for repeat laboratory work, further evaluation and consideration of IV iron. She feels tired, but otherwise well. She has no neurologic complaints. She denies any recent fevers or illnesses. She is gaining weight appropriately. She has no chest pain or shortness of breath. She denies any nausea, vomiting, constipation, or diarrhea. She denies any melena or hematochezia. She has no urinary complaints. Patient feels at her baseline and offers no specific complaints today.  REVIEW OF SYSTEMS:   Review of Systems  Constitutional: Positive for malaise/fatigue. Negative for fever and weight loss.  Respiratory: Negative.  Negative for cough and shortness of breath.   Cardiovascular: Negative.  Negative for chest pain and leg swelling.  Gastrointestinal: Negative.  Negative for abdominal pain, blood in stool and melena.  Genitourinary: Negative.   Musculoskeletal: Negative.   Neurological: Negative.  Negative for weakness.  Psychiatric/Behavioral: Negative.  The patient is not nervous/anxious.     As per HPI. Otherwise, a complete review of systems is negative.  PAST MEDICAL HISTORY: Past Medical History:  Diagnosis Date  . H/O: C-section 2008 and 2011  . MS (multiple sclerosis) ([place]) 2011    PAST SURGICAL HISTORY: Past Surgical History:  Procedure Laterality Date  . CESAREAN SECTION    . LAPAROSCOPY ABDOMEN DIAGNOSTIC      FAMILY HISTORY: Reviewed and unchanged. No reported history of malignancy or chronic disease.  ADVANCED DIRECTIVES (Y/N):  N  HEALTH MAINTENANCE: Social History  Substance Use Topics   . Smoking status: Never Smoker  . Smokeless tobacco: Never Used  . Alcohol use No     Colonoscopy:  PAP:  Bone density:  Lipid panel:  No Known Allergies  Current Outpatient Prescriptions  Medication Sig Dispense Refill  . ferrous sulfate 325 (65 FE) MG tablet Take 325 mg by mouth daily with breakfast.    . Prenatal Vit-Fe Fumarate-FA (MULTIVITAMIN-PRENATAL) 27-0.8 MG TABS tablet Take 1 tablet by mouth daily at 12 noon.     No current facility-administered medications for this visit.     OBJECTIVE: Vitals:   09/21/16 1413  BP: 114/63  Pulse: 83  Resp: 18  Temp: 97.7 F (36.5 C)     Body mass index is 30.53 kg/m.    ECOG FS:0 - Asymptomatic  General: Well-developed, well-nourished, no acute distress. Eyes: Pink conjunctiva, anicteric sclera. Lungs: Clear to auscultation bilaterally. Heart: Regular rate and rhythm. No rubs, murmurs, or gallops. Abdomen: Appears appropriate for gestational age. Musculoskeletal: No edema, cyanosis, or clubbing. Neuro: Alert, answering all questions appropriately. Cranial nerves grossly intact. Skin: No rashes or petechiae noted. Psych: Normal affect.   LAB RESULTS:  Lab Results  Component Value Date   NA 139 02/29/2012   K 4.0 02/29/2012   CL 106 02/29/2012   CO2 25 02/29/2012   GLUCOSE 98 02/29/2012   BUN 5 (L) 02/29/2012   CREATININE 0.62 02/29/2012   CALCIUM 8.4 (L) 02/29/2012   PROT 7.4 02/29/2012   ALBUMIN 3.0 (L) 02/29/2012   AST 17 02/29/2012   ALT 13 02/29/2012   ALKPHOS 70 02/29/2012   BILITOT 0.2 02/29/2012   GFRNONAA >60 02/29/2012   GFRAA >60 02/29/2012    Lab Results  Component  Value Date   WBC 6.8 09/21/2016   NEUTROABS 4.8 09/21/2016   HGB 11.4 (L) 09/21/2016   HCT 34.6 (L) 09/21/2016   MCV 86.4 09/21/2016   PLT 205 09/21/2016   Lab Results  Component Value Date   IRON 100 09/21/2016   TIBC 349 09/21/2016   IRONPCTSAT 29 09/21/2016   Lab Results  Component Value Date   FERRITIN 46  09/21/2016     STUDIES: No results found.  ASSESSMENT: Maternal iron deficiency anemia complicating pregnancy in the third trimester.  PLAN:    1. Maternal iron deficiency anemia complicating pregnancy in the third trimester: Patient's hemoglobin has significantly improved and her iron stores are now within normal limits. Previously, the remainder of her blood work was either negative or within normal limits. She does not require 510mg  IV Feraheme today. Patient has been instructed to continue oral iron supplementation as directed. Return to clinic in 4 months for repeat laboratory work and further evaluation. 2. Pregnancy: Patient is having a scheduled C-section on Oct 12, 2016.  Patient expressed understanding and was in agreement with this plan. She also understands that She can call clinic at any time with any questions, concerns, or complaints.    Lloyd Huger, MD   09/22/2016 4:06 PM

## 2016-09-21 ENCOUNTER — Inpatient Hospital Stay: Payer: Managed Care, Other (non HMO) | Attending: Oncology

## 2016-09-21 ENCOUNTER — Inpatient Hospital Stay (HOSPITAL_BASED_OUTPATIENT_CLINIC_OR_DEPARTMENT_OTHER): Payer: Managed Care, Other (non HMO) | Admitting: Oncology

## 2016-09-21 ENCOUNTER — Inpatient Hospital Stay: Payer: Managed Care, Other (non HMO)

## 2016-09-21 VITALS — BP 114/63 | HR 83 | Temp 97.7°F | Resp 18 | Wt 194.9 lb

## 2016-09-21 DIAGNOSIS — Z79899 Other long term (current) drug therapy: Secondary | ICD-10-CM | POA: Insufficient documentation

## 2016-09-21 DIAGNOSIS — D509 Iron deficiency anemia, unspecified: Secondary | ICD-10-CM

## 2016-09-21 DIAGNOSIS — O99013 Anemia complicating pregnancy, third trimester: Secondary | ICD-10-CM | POA: Diagnosis not present

## 2016-09-21 DIAGNOSIS — G35 Multiple sclerosis: Secondary | ICD-10-CM | POA: Insufficient documentation

## 2016-09-21 LAB — CBC WITH DIFFERENTIAL/PLATELET
Basophils Absolute: 0 10*3/uL (ref 0–0.1)
Basophils Relative: 1 %
Eosinophils Absolute: 0.1 10*3/uL (ref 0–0.7)
Eosinophils Relative: 1 %
HCT: 34.6 % — ABNORMAL LOW (ref 35.0–47.0)
HEMOGLOBIN: 11.4 g/dL — AB (ref 12.0–16.0)
LYMPHS ABS: 1.5 10*3/uL (ref 1.0–3.6)
LYMPHS PCT: 23 %
MCH: 28.6 pg (ref 26.0–34.0)
MCHC: 33.1 g/dL (ref 32.0–36.0)
MCV: 86.4 fL (ref 80.0–100.0)
Monocytes Absolute: 0.4 10*3/uL (ref 0.2–0.9)
Monocytes Relative: 6 %
NEUTROS ABS: 4.8 10*3/uL (ref 1.4–6.5)
NEUTROS PCT: 69 %
PLATELETS: 205 10*3/uL (ref 150–440)
RBC: 4.01 MIL/uL (ref 3.80–5.20)
RDW: 17.5 % — ABNORMAL HIGH (ref 11.5–14.5)
WBC: 6.8 10*3/uL (ref 3.6–11.0)

## 2016-09-21 LAB — IRON AND TIBC
Iron: 100 ug/dL (ref 28–170)
Saturation Ratios: 29 % (ref 10.4–31.8)
TIBC: 349 ug/dL (ref 250–450)
UIBC: 249 ug/dL

## 2016-09-21 LAB — FERRITIN: Ferritin: 46 ng/mL (ref 11–307)

## 2016-09-21 NOTE — Progress Notes (Signed)
Offers no complaints  

## 2016-10-03 NOTE — H&P (Signed)
[name] is a 36 y.o. 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 OB History    Gravida Para Term Preterm AB Living   4       1 3    SAB TAB Ectopic Multiple Live Births   1             Past Medical History:  Diagnosis Date  . H/O: C-section 2008 and 2011  . MS (multiple sclerosis) ([place]) 2011   Past Surgical History:  Procedure Laterality Date  . CESAREAN SECTION    . LAPAROSCOPY ABDOMEN DIAGNOSTIC     Family History: family history is not on file. Social History:  reports that she has never smoked. She has never used smokeless tobacco. She reports that she does not drink alcohol or use drugs.     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 Exam Physical Exam  Prenatal labs: ABO, Rh:  o+ Antibody:  neg Rubella:  IMM RPR:   neg HBsAg:   neg HIV:   neg GBS:   neg  Assessment/Plan: Elective repeat ltcs and BTL on 10/12/2016 risk of the procedure have been explained    Gwen Her [name] 10/03/2016, 9:49 AM

## 2016-10-04 ENCOUNTER — Encounter: Payer: Self-pay | Admitting: *Deleted

## 2016-10-04 ENCOUNTER — Encounter
Admission: EM | Disposition: A | Discharge status: Home / Self Care | Payer: Self-pay | Source: Ambulatory Visit | Attending: Obstetrics and Gynecology

## 2016-10-04 ENCOUNTER — Inpatient Hospital Stay
Admission: EM | Admit: 2016-10-04 | Discharge: 2016-10-06 | DRG: 765 | Disposition: A | Discharge status: Home / Self Care | Payer: Managed Care, Other (non HMO) | Source: Ambulatory Visit | Attending: Obstetrics and Gynecology | Admitting: Obstetrics and Gynecology

## 2016-10-04 ENCOUNTER — Inpatient Hospital Stay: Payer: Managed Care, Other (non HMO) | Admitting: Anesthesiology

## 2016-10-04 DIAGNOSIS — O34211 Maternal care for low transverse scar from previous cesarean delivery: Secondary | ICD-10-CM | POA: Diagnosis present

## 2016-10-04 DIAGNOSIS — N939 Abnormal uterine and vaginal bleeding, unspecified: Secondary | ICD-10-CM | POA: Diagnosis present

## 2016-10-04 DIAGNOSIS — O358XX0 Maternal care for other (suspected) fetal abnormality and damage, not applicable or unspecified: Secondary | ICD-10-CM | POA: Diagnosis present

## 2016-10-04 DIAGNOSIS — Z3A37 37 weeks gestation of pregnancy: Secondary | ICD-10-CM

## 2016-10-04 DIAGNOSIS — G35 Multiple sclerosis: Secondary | ICD-10-CM | POA: Diagnosis present

## 2016-10-04 DIAGNOSIS — O4693 Antepartum hemorrhage, unspecified, third trimester: Secondary | ICD-10-CM | POA: Diagnosis present

## 2016-10-04 DIAGNOSIS — D509 Iron deficiency anemia, unspecified: Secondary | ICD-10-CM | POA: Diagnosis present

## 2016-10-04 DIAGNOSIS — Z302 Encounter for sterilization: Secondary | ICD-10-CM | POA: Diagnosis not present

## 2016-10-04 DIAGNOSIS — O9902 Anemia complicating childbirth: Secondary | ICD-10-CM | POA: Diagnosis present

## 2016-10-04 DIAGNOSIS — Z9889 Other specified postprocedural states: Secondary | ICD-10-CM

## 2016-10-04 LAB — CBC
HEMATOCRIT: 35.4 % (ref 35.0–47.0)
Hemoglobin: 11.5 g/dL — ABNORMAL LOW (ref 12.0–16.0)
MCH: 28.7 pg (ref 26.0–34.0)
MCHC: 32.6 g/dL (ref 32.0–36.0)
MCV: 87.9 fL (ref 80.0–100.0)
Platelets: 186 10*3/uL (ref 150–440)
RBC: 4.03 MIL/uL (ref 3.80–5.20)
RDW: 16.8 % — ABNORMAL HIGH (ref 11.5–14.5)
WBC: 5.7 10*3/uL (ref 3.6–11.0)

## 2016-10-04 LAB — RAPID HIV SCREEN (HIV 1/2 AB+AG)
HIV 1/2 ANTIBODIES: NONREACTIVE
HIV-1 P24 Antigen - HIV24: NONREACTIVE

## 2016-10-04 LAB — TYPE AND SCREEN
ABO/RH(D): O POS
ANTIBODY SCREEN: NEGATIVE

## 2016-10-04 SURGERY — Surgical Case
Anesthesia: Spinal | Site: Abdomen | Wound class: Clean Contaminated

## 2016-10-04 MED ORDER — NALBUPHINE HCL 10 MG/ML IJ SOLN: 5.0000 mg | Freq: Once | INTRAMUSCULAR | Status: AC | PRN

## 2016-10-04 MED ORDER — SOD CITRATE-CITRIC ACID 500-334 MG/5ML PO SOLN
30.0000 mL | ORAL | Status: AC
  Administered 2016-10-04: 30 mL via ORAL
  Filled 2016-10-04: qty 15

## 2016-10-04 MED ORDER — MIDAZOLAM HCL 2 MG/2ML IJ SOLN
INTRAMUSCULAR | Status: DC | PRN
  Administered 2016-10-04 – 2016-10-05 (×3): 1 mg via INTRAVENOUS

## 2016-10-04 MED ORDER — FENTANYL CITRATE (PF) 100 MCG/2ML IJ SOLN
INTRAMUSCULAR | Status: DC | PRN
  Administered 2016-10-04 (×2): 50 ug via INTRAVENOUS

## 2016-10-04 MED ORDER — PHENYLEPHRINE 40 MCG/ML (10ML) SYRINGE FOR IV PUSH (FOR BLOOD PRESSURE SUPPORT)
PREFILLED_SYRINGE | INTRAVENOUS | Status: DC | PRN
  Administered 2016-10-04 (×2): 80 ug via INTRAVENOUS

## 2016-10-04 MED ORDER — CEFAZOLIN SODIUM-DEXTROSE 2-4 GM/100ML-% IV SOLN: 2.0000 g | INTRAVENOUS | Status: DC

## 2016-10-04 MED ORDER — BUPIVACAINE LIPOSOME 1.3 % IJ SUSP
INTRAMUSCULAR | Status: DC | PRN
  Administered 2016-10-04: 90 mL

## 2016-10-04 MED ORDER — ONDANSETRON HCL 4 MG/2ML IJ SOLN: 4.0000 mg | Freq: Three times a day (TID) | INTRAMUSCULAR | Status: DC | PRN

## 2016-10-04 MED ORDER — EVICEL 2 ML EX KIT
PACK | CUTANEOUS | Status: AC
  Filled 2016-10-04: qty 1

## 2016-10-04 MED ORDER — EVICEL 5 ML EX KIT
PACK | CUTANEOUS | Status: AC
  Filled 2016-10-04: qty 1

## 2016-10-04 MED ORDER — SODIUM CHLORIDE 0.9% FLUSH: 3.0000 mL | INTRAVENOUS | Status: DC | PRN

## 2016-10-04 MED ORDER — TRANEXAMIC ACID 1000 MG/10ML IV SOLN
INTRAVENOUS | Status: AC
  Filled 2016-10-04: qty 10

## 2016-10-04 MED ORDER — LACTATED RINGERS IV SOLN: INTRAVENOUS | Status: DC

## 2016-10-04 MED ORDER — OXYTOCIN 40 UNITS IN LACTATED RINGERS INFUSION - SIMPLE MED
INTRAVENOUS | Status: DC | PRN
  Administered 2016-10-04: 1 mL via INTRAVENOUS
  Administered 2016-10-05: 799 mL via INTRAVENOUS

## 2016-10-04 MED ORDER — ONDANSETRON HCL 4 MG/2ML IJ SOLN: 4.0000 mg | Freq: Four times a day (QID) | INTRAMUSCULAR | Status: DC | PRN

## 2016-10-04 MED ORDER — BUPIVACAINE HCL (PF) 0.5 % IJ SOLN
INTRAMUSCULAR | Status: AC
  Filled 2016-10-04: qty 30

## 2016-10-04 MED ORDER — FENTANYL CITRATE (PF) 100 MCG/2ML IJ SOLN
INTRAMUSCULAR | Status: AC
  Filled 2016-10-04: qty 2

## 2016-10-04 MED ORDER — FENTANYL CITRATE (PF) 100 MCG/2ML IJ SOLN
25.0000 ug | INTRAMUSCULAR | Status: AC | PRN
  Administered 2016-10-05 (×6): 25 ug via INTRAVENOUS

## 2016-10-04 MED ORDER — DEXTROSE 5 % IV SOLN
2.0000 g | Freq: Once | INTRAVENOUS | Status: DC
  Filled 2016-10-04: qty 2000

## 2016-10-04 MED ORDER — NALOXONE HCL 0.4 MG/ML IJ SOLN: 0.4000 mg | INTRAMUSCULAR | Status: DC | PRN

## 2016-10-04 MED ORDER — OXYTOCIN 40 UNITS IN LACTATED RINGERS INFUSION - SIMPLE MED
2.5000 [IU]/h | INTRAVENOUS | Status: DC
  Filled 2016-10-04: qty 1000

## 2016-10-04 MED ORDER — SCOPOLAMINE 1 MG/3DAYS TD PT72: 1.0000 | MEDICATED_PATCH | Freq: Once | TRANSDERMAL | Status: DC

## 2016-10-04 MED ORDER — CEFAZOLIN SODIUM-DEXTROSE 2-4 GM/100ML-% IV SOLN: 2.0000 g | Freq: Once | INTRAVENOUS | Status: DC

## 2016-10-04 MED ORDER — MEPERIDINE HCL 25 MG/ML IJ SOLN
6.2500 mg | INTRAMUSCULAR | Status: DC | PRN
Start: 2016-10-04 — End: 2016-10-05

## 2016-10-04 MED ORDER — LIDOCAINE HCL (PF) 1 % IJ SOLN: 30.0000 mL | INTRAMUSCULAR | Status: DC | PRN

## 2016-10-04 MED ORDER — BUTORPHANOL TARTRATE 2 MG/ML IJ SOLN: 1.0000 mg | INTRAMUSCULAR | Status: DC | PRN

## 2016-10-04 MED ORDER — EPHEDRINE SULFATE 50 MG/ML IJ SOLN
INTRAMUSCULAR | Status: DC | PRN
  Administered 2016-10-04: 5 mg via INTRAVENOUS

## 2016-10-04 MED ORDER — DIPHENHYDRAMINE HCL 50 MG/ML IJ SOLN
12.5000 mg | INTRAMUSCULAR | Status: DC | PRN
  Administered 2016-10-05: 12.5 mg via INTRAVENOUS
  Filled 2016-10-04: qty 1

## 2016-10-04 MED ORDER — ONDANSETRON HCL 4 MG/2ML IJ SOLN
INTRAMUSCULAR | Status: DC | PRN
  Administered 2016-10-04: 4 mg via INTRAVENOUS

## 2016-10-04 MED ORDER — ONDANSETRON HCL 4 MG/2ML IJ SOLN
INTRAMUSCULAR | Status: AC
  Filled 2016-10-04: qty 2

## 2016-10-04 MED ORDER — DEXTROSE 5 % IV SOLN
1.0000 ug/kg/h | INTRAVENOUS | Status: DC | PRN
  Filled 2016-10-04: qty 2

## 2016-10-04 MED ORDER — MIDAZOLAM HCL 2 MG/2ML IJ SOLN
INTRAMUSCULAR | Status: AC
  Filled 2016-10-04: qty 2

## 2016-10-04 MED ORDER — IBUPROFEN 600 MG PO TABS
600.0000 mg | ORAL_TABLET | Freq: Four times a day (QID) | ORAL | Status: DC | PRN
  Filled 2016-10-04: qty 1

## 2016-10-04 MED ORDER — SOD CITRATE-CITRIC ACID 500-334 MG/5ML PO SOLN: 30.0000 mL | ORAL | Status: DC | PRN

## 2016-10-04 MED ORDER — ONDANSETRON HCL 4 MG/2ML IJ SOLN
4.0000 mg | Freq: Once | INTRAMUSCULAR | Status: DC | PRN
Start: 2016-10-04 — End: 2016-10-05

## 2016-10-04 MED ORDER — BUPIVACAINE LIPOSOME 1.3 % IJ SUSP
INTRAMUSCULAR | Status: AC
  Filled 2016-10-04: qty 20

## 2016-10-04 MED ORDER — LACTATED RINGERS IV SOLN
500.0000 mL | INTRAVENOUS | Status: DC | PRN
Start: 2016-10-04 — End: 2016-10-05
  Administered 2016-10-04: 1000 mL via INTRAVENOUS

## 2016-10-04 MED ORDER — MORPHINE SULFATE (PF) 0.5 MG/ML IJ SOLN
INTRAMUSCULAR | Status: DC | PRN
  Administered 2016-10-04: .1 mg via INTRATHECAL

## 2016-10-04 MED ORDER — NALBUPHINE HCL 10 MG/ML IJ SOLN
5.0000 mg | Freq: Once | INTRAMUSCULAR | Status: AC | PRN
  Administered 2016-10-05: 5 mg via INTRAVENOUS

## 2016-10-04 MED ORDER — LACTATED RINGERS IV SOLN
INTRAVENOUS | Status: DC | PRN
  Administered 2016-10-04 (×2): via INTRAVENOUS

## 2016-10-04 MED ORDER — MORPHINE SULFATE (PF) 0.5 MG/ML IJ SOLN
INTRAMUSCULAR | Status: AC
  Filled 2016-10-04: qty 10

## 2016-10-04 MED ORDER — BUPIVACAINE LIPOSOME 1.3 % IJ SUSP: 20.0000 mL | Freq: Once | INTRAMUSCULAR | Status: DC

## 2016-10-04 MED ORDER — SODIUM CHLORIDE 0.9 % IJ SOLN
INTRAMUSCULAR | Status: AC
  Filled 2016-10-04: qty 50

## 2016-10-04 MED ORDER — NALBUPHINE HCL 10 MG/ML IJ SOLN: 5.0000 mg | INTRAMUSCULAR | Status: DC | PRN

## 2016-10-04 MED ORDER — PROPOFOL 10 MG/ML IV BOLUS
INTRAVENOUS | Status: AC
  Filled 2016-10-04: qty 20

## 2016-10-04 MED ORDER — NALBUPHINE HCL 10 MG/ML IJ SOLN
5.0000 mg | INTRAMUSCULAR | Status: DC | PRN
  Administered 2016-10-05: 5 mg via INTRAVENOUS
  Filled 2016-10-04: qty 1

## 2016-10-04 MED ORDER — DIPHENHYDRAMINE HCL 25 MG PO CAPS: 25.0000 mg | ORAL_CAPSULE | ORAL | Status: DC | PRN

## 2016-10-04 MED ORDER — ACETAMINOPHEN 325 MG PO TABS: 650.0000 mg | ORAL_TABLET | ORAL | Status: DC | PRN

## 2016-10-04 MED ORDER — PHENYLEPHRINE HCL 10 MG/ML IJ SOLN
INTRAMUSCULAR | Status: AC
  Filled 2016-10-04: qty 2

## 2016-10-04 MED ORDER — DEXTROSE 5 % IV SOLN
2.0000 g | Freq: Once | INTRAVENOUS | Status: AC
  Administered 2016-10-04: 2 g via INTRAVENOUS
  Filled 2016-10-04: qty 2000

## 2016-10-04 MED ORDER — OXYTOCIN BOLUS FROM INFUSION: 500.0000 mL | Freq: Once | INTRAVENOUS | Status: DC

## 2016-10-04 SURGICAL SUPPLY — 32 items
BARRIER ADHS 3X4 INTERCEED (GAUZE/BANDAGES/DRESSINGS) ×3 IMPLANT
CANISTER SUCT 3000ML PPV (MISCELLANEOUS) ×3 IMPLANT
CATH KIT ON-Q SILVERSOAK 5IN (CATHETERS) IMPLANT
CHLORAPREP W/TINT 26ML (MISCELLANEOUS) ×6 IMPLANT
DRSG TELFA 3X8 NADH (GAUZE/BANDAGES/DRESSINGS) ×3 IMPLANT
ELECT CAUTERY BLADE 6.4 (BLADE) ×3 IMPLANT
ELECT REM PT RETURN 9FT ADLT (ELECTROSURGICAL) ×3
ELECTRODE REM PT RTRN 9FT ADLT (ELECTROSURGICAL) ×1 IMPLANT
GAUZE SPONGE 4X4 12PLY STRL (GAUZE/BANDAGES/DRESSINGS) ×3 IMPLANT
GLOVE BIO SURGEON STRL SZ8 (GLOVE) ×24 IMPLANT
GOWN STRL REUS W/ TWL LRG LVL3 (GOWN DISPOSABLE) ×2 IMPLANT
GOWN STRL REUS W/ TWL XL LVL3 (GOWN DISPOSABLE) ×1 IMPLANT
GOWN STRL REUS W/TWL LRG LVL3 (GOWN DISPOSABLE) ×4
GOWN STRL REUS W/TWL XL LVL3 (GOWN DISPOSABLE) ×2
NDL SAFETY 22GX1.5 (NEEDLE) ×3 IMPLANT
NS IRRIG 1000ML POUR BTL (IV SOLUTION) ×3 IMPLANT
PACK C SECTION AR (MISCELLANEOUS) ×3 IMPLANT
PAD OB MATERNITY 4.3X12.25 (PERSONAL CARE ITEMS) ×3 IMPLANT
PAD PREP 24X41 OB/GYN DISP (PERSONAL CARE ITEMS) ×3 IMPLANT
SLEEVE SCD COMPRESS THIGH MED (MISCELLANEOUS) ×3 IMPLANT
SPONGE LAP 18X18 5 PK (GAUZE/BANDAGES/DRESSINGS) ×15 IMPLANT
STAPLER INSORB 30 2030 C-SECTI (MISCELLANEOUS) ×3 IMPLANT
STRAP SAFETY BODY (MISCELLANEOUS) ×3 IMPLANT
SUCT VACUUM KIWI BELL (SUCTIONS) ×3 IMPLANT
SUT CHROMIC 1 CTX 36 (SUTURE) ×18 IMPLANT
SUT PLAIN GUT 0 (SUTURE) ×9 IMPLANT
SUT VIC AB 0 CT1 36 (SUTURE) ×9 IMPLANT
SUT VIC AB 2-0 SH 27 (SUTURE) ×8
SUT VIC AB 2-0 SH 27XBRD (SUTURE) ×4 IMPLANT
SUT VIC AB 4-0 SH 27 (SUTURE) ×2
SUT VIC AB 4-0 SH 27XANBCTRL (SUTURE) ×1 IMPLANT
SYR 30ML LL (SYRINGE) ×6 IMPLANT

## 2016-10-04 NOTE — H&P (Signed)
[name] is a 36 y.o. female presenting . 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 LAst U/S 09/29/16 Ovoid head I, head circumference is within 1 SD of mean   cx exam 1-2 cm /30/ -3 clotted blood on exam glove   Past Medical History:  Diagnosis Date  . H/O: C-section 2008 and 2011  . MS (multiple sclerosis) ([place]) 2011   Past Surgical History:  Procedure Laterality Date  . CESAREAN SECTION    . LAPAROSCOPY ABDOMEN DIAGNOSTIC     Family History: family history is not on file. Social History:  reports that she has never smoked. She has never used smokeless tobacco. She reports that she does not drink alcohol or use drugs.     Maternal Diabetes: No Genetic Screening: Normal, no AFP test Maternal Ultrasounds/Referrals: Abnormal:  Findings:   Other:head Dolichocephaly  Fetal Ultrasounds or other Referrals:  Referred to Materal Fetal Medicine  Maternal Substance Abuse:  No Significant Maternal Medications:  Meds include: Other: IV iron  Significant Maternal Lab Results:  None Other Comments:  None  ROS History   Last menstrual period 01/13/2016. Exam VSS  WDWN B female in NAD  Lungs CTA CV RRR  ABD soft nontender  cx 1-2 cm / 30% / -3   NST 150 's . Adequate variability , no decels , no CTX  Physical Exam  Prenatal labs: ABO, Rh:  O+ Antibody:  neg Rubella:  IMM  vzv Imm RPR:   NR HBsAg:   neg HIV:   neg GBS:   ??  Assessment/Plan: Cervical change and large moderate uterine bleeding . reassuring fetal monitoring . Bloody show , doubt abruption based on exam and fetal strip. Proceed to repeat LTCS and BTL   Pt has been counseled by me again for possible risks. All questions answered .  [name] 10/04/2016, 8:30 PM

## 2016-10-04 NOTE — Anesthesia Preprocedure Evaluation (Signed)
Anesthesia Evaluation  Patient identified by MRN, date of birth, ID band Patient awake    Reviewed: Allergy & Precautions, NPO status , Patient's Chart, lab work & pertinent test results  History of Anesthesia Complications Negative for: history of anesthetic complications  Airway Mallampati: II       Dental   Pulmonary neg pulmonary ROS,           Cardiovascular negative cardio ROS       Neuro/Psych Multiple sclerosis    GI/Hepatic negative GI ROS, Neg liver ROS,   Endo/Other  negative endocrine ROS  Renal/GU negative Renal ROS     Musculoskeletal   Abdominal   Peds  Hematology  (+) anemia ,   Anesthesia Other Findings   Reproductive/Obstetrics                             Anesthesia Physical Anesthesia Plan  ASA: II and emergent  Anesthesia Plan: Spinal   Post-op Pain Management:    Induction:   Airway Management Planned:   Additional Equipment:   Intra-op Plan:   Post-operative Plan:   Informed Consent: I have reviewed the patients History and Physical, chart, labs and discussed the procedure including the risks, benefits and alternatives for the proposed anesthesia with the patient or authorized representative who has indicated his/her understanding and acceptance.     Plan Discussed with:   Anesthesia Plan Comments:         Anesthesia Quick Evaluation

## 2016-10-04 NOTE — OB Triage Note (Signed)
Recvd pt from ED by day shift nurse. Pt c/o bleeding on her pad in the amount of a golf ball. States she has not had intercourse in the past 24 hours. Pt says she has been lifting heavy boxes and cleaning/on her feet more than usual the past 3-4 days.

## 2016-10-05 ENCOUNTER — Encounter: Payer: Self-pay | Admitting: Obstetrics and Gynecology

## 2016-10-05 DIAGNOSIS — Z9889 Other specified postprocedural states: Secondary | ICD-10-CM

## 2016-10-05 LAB — CBC
HCT: 29.5 % — ABNORMAL LOW (ref 35.0–47.0)
Hemoglobin: 9.7 g/dL — ABNORMAL LOW (ref 12.0–16.0)
MCH: 29 pg (ref 26.0–34.0)
MCHC: 32.9 g/dL (ref 32.0–36.0)
MCV: 88.2 fL (ref 80.0–100.0)
PLATELETS: 162 10*3/uL (ref 150–440)
RBC: 3.34 MIL/uL — AB (ref 3.80–5.20)
RDW: 16.5 % — AB (ref 11.5–14.5)
WBC: 11.9 10*3/uL — AB (ref 3.6–11.0)

## 2016-10-05 MED ORDER — OXYCODONE HCL 5 MG PO TABS
10.0000 mg | ORAL_TABLET | ORAL | Status: DC | PRN
  Administered 2016-10-05 – 2016-10-06 (×3): 10 mg via ORAL
  Filled 2016-10-05 (×3): qty 2

## 2016-10-05 MED ORDER — MIDAZOLAM HCL 2 MG/2ML IJ SOLN
INTRAMUSCULAR | Status: AC
  Filled 2016-10-05: qty 2

## 2016-10-05 MED ORDER — SIMETHICONE 80 MG PO CHEW: 80.0000 mg | CHEWABLE_TABLET | ORAL | Status: DC | PRN

## 2016-10-05 MED ORDER — PRENATAL MULTIVITAMIN CH
1.0000 | ORAL_TABLET | Freq: Every day | ORAL | Status: DC
  Administered 2016-10-05 – 2016-10-06 (×2): 1 via ORAL
  Filled 2016-10-05 (×2): qty 1

## 2016-10-05 MED ORDER — IBUPROFEN 600 MG PO TABS
600.0000 mg | ORAL_TABLET | Freq: Four times a day (QID) | ORAL | Status: DC
  Administered 2016-10-05: 600 mg via ORAL
  Filled 2016-10-05: qty 1

## 2016-10-05 MED ORDER — DIBUCAINE 1 % RE OINT: 1.0000 "application " | TOPICAL_OINTMENT | RECTAL | Status: DC | PRN

## 2016-10-05 MED ORDER — NALBUPHINE HCL 10 MG/ML IJ SOLN
INTRAMUSCULAR | Status: AC
  Administered 2016-10-05: 5 mg via INTRAVENOUS
  Filled 2016-10-05: qty 1

## 2016-10-05 MED ORDER — ZOLPIDEM TARTRATE 5 MG PO TABS: 5.0000 mg | ORAL_TABLET | Freq: Every evening | ORAL | Status: DC | PRN

## 2016-10-05 MED ORDER — SIMETHICONE 80 MG PO CHEW
80.0000 mg | CHEWABLE_TABLET | Freq: Three times a day (TID) | ORAL | Status: DC
  Administered 2016-10-05 – 2016-10-06 (×5): 80 mg via ORAL
  Filled 2016-10-05 (×6): qty 1

## 2016-10-05 MED ORDER — TETANUS-DIPHTH-ACELL PERTUSSIS 5-2.5-18.5 LF-MCG/0.5 IM SUSP: 0.5000 mL | Freq: Once | INTRAMUSCULAR | Status: DC

## 2016-10-05 MED ORDER — LACTATED RINGERS IV SOLN: INTRAVENOUS | Status: DC

## 2016-10-05 MED ORDER — OXYTOCIN 40 UNITS IN LACTATED RINGERS INFUSION - SIMPLE MED
2.5000 [IU]/h | INTRAVENOUS | Status: AC
  Administered 2016-10-05: 2.5 [IU]/h via INTRAVENOUS
  Filled 2016-10-05: qty 1000

## 2016-10-05 MED ORDER — DIPHENHYDRAMINE HCL 25 MG PO CAPS: 25.0000 mg | ORAL_CAPSULE | Freq: Four times a day (QID) | ORAL | Status: DC | PRN

## 2016-10-05 MED ORDER — SIMETHICONE 80 MG PO CHEW
80.0000 mg | CHEWABLE_TABLET | ORAL | Status: DC
  Administered 2016-10-06: 80 mg via ORAL

## 2016-10-05 MED ORDER — SENNOSIDES-DOCUSATE SODIUM 8.6-50 MG PO TABS
2.0000 | ORAL_TABLET | ORAL | Status: DC
  Administered 2016-10-06: 2 via ORAL
  Filled 2016-10-05: qty 2

## 2016-10-05 MED ORDER — OXYCODONE HCL 5 MG PO TABS
5.0000 mg | ORAL_TABLET | ORAL | Status: DC | PRN
  Administered 2016-10-06: 5 mg via ORAL
  Filled 2016-10-05: qty 1

## 2016-10-05 MED ORDER — FENTANYL CITRATE (PF) 100 MCG/2ML IJ SOLN
INTRAMUSCULAR | Status: AC
  Administered 2016-10-05: 25 ug via INTRAVENOUS
  Filled 2016-10-05: qty 2

## 2016-10-05 MED ORDER — COCONUT OIL OIL: 1.0000 "application " | TOPICAL_OIL | Status: DC | PRN

## 2016-10-05 MED ORDER — MENTHOL 3 MG MT LOZG
1.0000 | LOZENGE | OROMUCOSAL | Status: DC | PRN
  Filled 2016-10-05: qty 9

## 2016-10-05 MED ORDER — IBUPROFEN 600 MG PO TABS
600.0000 mg | ORAL_TABLET | Freq: Four times a day (QID) | ORAL | Status: DC
  Administered 2016-10-05 – 2016-10-06 (×5): 600 mg via ORAL
  Filled 2016-10-05 (×5): qty 1

## 2016-10-05 MED ORDER — WITCH HAZEL-GLYCERIN EX PADS: 1.0000 "application " | MEDICATED_PAD | CUTANEOUS | Status: DC | PRN

## 2016-10-05 MED ORDER — WHITE PETROLATUM GEL
Status: AC
  Filled 2016-10-05: qty 10

## 2016-10-05 MED ORDER — ACETAMINOPHEN 325 MG PO TABS: 650.0000 mg | ORAL_TABLET | ORAL | Status: DC | PRN

## 2016-10-05 NOTE — Anesthesia Post-op Follow-up Note (Signed)
  Anesthesia Pain Follow-up Note  Patient: [name]  Day #: 1  Date of Follow-up: 10/05/2016 Time: 7:39 AM  Last Vitals:  Vitals:   10/05/16 0341 10/05/16 0443  BP: 111/62 (!) 107/57  Pulse: 68 67  Resp: 17 18  Temp: 36.6 C 36.5 C    Level of Consciousness: alert  Pain: none   Side Effects:None  Catheter Site Exam:clean, dry     Plan: D/C from anesthesia care at surgeon's request  [employer] [facility] [facility]

## 2016-10-05 NOTE — Anesthesia Postprocedure Evaluation (Signed)
Anesthesia Post Note  Patient: [name]  Procedure(s) Performed: Procedure(s) (LRB): CESAREAN SECTION with bilateral tubal ligation (N/A)  Patient location during evaluation: Mother Baby Anesthesia Type: Spinal Level of consciousness: awake, awake and alert and oriented Pain management: pain level controlled Vital Signs Assessment: post-procedure vital signs reviewed and stable Respiratory status: nonlabored ventilation, spontaneous breathing and respiratory function stable Cardiovascular status: blood pressure returned to baseline and stable Postop Assessment: no headache and no backache Anesthetic complications: no     Last Vitals:  Vitals:   10/05/16 0341 10/05/16 0443  BP: 111/62 (!) 107/57  Pulse: 68 67  Resp: 17 18  Temp: 36.6 C 36.5 C    Last Pain:  Vitals:   10/05/16 0443  TempSrc: Oral  PainSc:                  Johnna Acosta

## 2016-10-05 NOTE — Op Note (Signed)
NAME:  [name], [name]                ACCOUNT NO.:  MEDICAL RECORD NO.:  [id number]  LOCATION:                                 FACILITY:  PHYSICIAN:  Laverta Baltimore, MDDATE OF BIRTH:  [birth year]  DATE OF PROCEDURE: DATE OF DISCHARGE:                              OPERATIVE REPORT   PREOPERATIVE DIAGNOSIS: 1. A 37+ 6 weeks' estimated gestational age. 2. Uterine bleeding and cervical change. 3. Elective permanent sterilization.  POSTOPERATIVE DIAGNOSIS: 1. A 37+ 6 weeks' estimated gestational age. 2. Uterine bleeding and cervical change. 3. Elective permanent sterilization.  PROCEDURE PERFORMED: 1. Repeat low transverse cesarean section. 2. Bilateral tubal ligation.  SURGEON:  Laverta Baltimore, MD  ANESTHESIA:  Spinal.  SURGEON:  Laverta Baltimore, MD.  FIRST ASSISTANT:  Scrub tech, Street.  INDICATION:  A 36 year old gravida 4, para 2, at 37+ 6 weeks, presented to Labor and Delivery with vaginal and uterine bleeding.  The patient's exam demonstrated clotting blood on cervical check and the cervix was estimated to be 1-2 cm dilated.  DESCRIPTION OF PROCEDURE:  After adequate spinal anesthesia, the patient was placed in dorsal supine position with hip rolled on the right side. The patient's abdomen was prepped and draped in normal sterile fashion. Time-out was performed.  The patient did receive 2 g IV Ancef prior to commencement of the case.  A Pfannenstiel incision was made 2 fingerbreadths above the symphysis pubis.  Sharp dissection was used to identify the fascia.  Fascia was opened in the midline and opened in transverse fashion.  Superior aspect of the fascia was grasped with Kocher clamps and the recti muscles were dissected free.  Inferior aspect of the fascia was grasped with Kocher clamps and pyramidalis muscle was dissected free.  Entry into the peritoneal cavity was accomplished sharply.  Vesicouterine peritoneal fold was identified and a  bladder flap was created.  The bladder was reflected inferiorly.  A low transverse uterine incision was made upon entry into the amniotic cavity.  Clear fluid resulted.  Fetal head was brought to the incision and a kiwi vacuum was attached to the occiput.  With 1 gentle pull, the head was delivered.  The vacuum was removed.  Loose nuchal cord was reduced and the fetal shoulders and body were delivered.  Vigorous female was dried on the abdomen and after 60 seconds, female's umbilical cord was doubly clamped and cut and passed to nursery staff who assigned Apgar scores of 8 and 9, weight 5 pounds 11 ounces.  Cord blood was collected and submitted for donor bank.  An additional 2 mL of cord blood was taken given the blood type of O positive.  Placenta was then delivered and the endometrial cavity was wiped clean with laparotomy tape.  Uterus was exteriorized.  The uterus was extremely bulbous and quite difficult to remove out of the abdomen.  Uterine incision was closed with 1 chromic suture in a running locking fashion.  Two additional figure-of- eight sutures were required.  Attention was directed to the patient's right fallopian tube, which was initially freed from some adhesions to the right ovary.  In the midportion, the fallopian tube was elevated and two separate 0 plain  gut sutures were applied to the fallopian tube and a 1.5 cm portion of fallopian tube was removed.  There was a small amount of oozing in the broad ligament vessels that required a single figure-of-eight 4-0 Vicryl suture.  Good hemostasis noted.  Similar procedure was repeated on the patient's left fallopian tube after grasping the midportion of fallopian tube.  Two separate 0 plain gut sutures were applied and 1.5 cm portion of fallopian tube was removed. After placing the uterus back into the abdominal cavity, there appeared to be some bleeding potentially from the right broad ligament.  The uterus was then  exteriorized again and doing so, fingerprint defects were made into the myometrium.  They required multiple suturing to control hemostasis.  Ultimately, good hemostasis was noted.  The uterus was again replaced back into the abdominal cavity.  Paracolic gutters were wiped clean with laparotomy tape.  The fascia was closed with 0 Vicryl suture in a running nonlocking fashion.  The skin inferiorly and superiorly were scarred down.  The skin was freed up with the Bovie to allow for the incision to come back together more cosmetically.  The fascia was injected with a solution of 20 mL of 1.3% Exparel with 30 mL of 0.5% Marcaine and 50 mL of normal saline, 30 mL injected on both sides of the fascia.  Subcutaneous tissues were irrigated and bovied for hemostasis and the skin was reapproximated with Insorb absorbable staples, good cosmetic effect.  The skin was then injected with remaining 30 mL of Exparel solution, total 90 mL injected.  There were no complications.  The patient tolerated the procedure well.  ESTIMATED BLOOD LOSS:  800 mL.  INTRAOPERATIVE FLUIDS:  2100 mL.  URINE OUTPUT:  100 mL.  The patient was taken to recovery room in good condition.          ______________________________ Laverta Baltimore, MD     TS/MEDQ  D:  10/05/2016  T:  10/05/2016  Job:  [id number]

## 2016-10-05 NOTE — Transfer of Care (Signed)
Immediate Anesthesia Transfer of Care Note  Patient: [name]  Procedure(s) Performed: Procedure(s): CESAREAN SECTION with bilateral tubal ligation (N/A)  Patient Location: PACU  Anesthesia Type:Spinal  Level of Consciousness: awake, alert , oriented and patient cooperative  Airway & Oxygen Therapy: Patient Spontanous Breathing  Post-op Assessment: Report given to RN  Post vital signs: Reviewed and stable  Last Vitals:  Vitals:   10/04/16 1900  BP: (!) 122/56  Pulse: 77    Last Pain: There were no vitals filed for this visit.       Complications: No apparent anesthesia complications

## 2016-10-05 NOTE — Progress Notes (Signed)
Subjective: Postpartum Day 1: Cesarean Delivery Patient reports pain is controlled. She is ambulating and tolerating regular diet.  Objective: Vital signs in last 24 hours: Temp:  [97 F (36.1 C)-98.1 F (36.7 C)] 98.1 F (36.7 C) (05/03 0816) Pulse Rate:  [59-77] 71 (05/03 0816) Resp:  [9-22] 18 (05/03 0816) BP: (97-122)/(56-78) 118/62 (05/03 0816) SpO2:  [99 %-100 %] 100 % (05/03 0816) Weight:  [194 lb (88 kg)] 194 lb (88 kg) (05/03 0231)  Physical Exam:  General: alert, cooperative and appears stated age 70: appropriate Uterine Fundus: firm Incision: healing well, no significant drainage, no dehiscence, no significant erythema DVT Evaluation: No evidence of DVT seen on physical exam. Negative Homan's sign.   Recent Labs  10/04/16 2022 10/05/16 0543  HGB 11.5* 9.7*  HCT 35.4 29.5*    Assessment/Plan: Status post Cesarean section. Doing well postoperatively.  Continue current care.  Benjaman Kindler 10/05/2016, 12:57 PM

## 2016-10-05 NOTE — Discharge Summary (Signed)
Obstetrical Discharge Summary  Patient Name: [name] DOB: 1980-09-14 MRN: [id number]  Date of Admission: 10/04/2016 Date of Delivery: 10/04/2016 at 2233 Delivered by: Schermerhorn Date of Discharge: 10/06/2016  Primary OB: [place] FVC:[id number]'F last menstrual period was 01/13/2016. EDC Estimated Date of Delivery: 10/19/16 Gestational Age at Delivery: 37+6 weeks Admitted with clotting uterine bleeding , cervix dilated 1-2 cm  Antepartum complications:Dolichocephaly Admitting Diagnosis: uterien bleeding and cervical dilation , previous c/s x2  Secondary Diagnosis:elective sterilization  Patient Active Problem List   Diagnosis Date Noted  . Post-operative state 10/05/2016  . Uterine bleeding 10/04/2016  . Uterine contractions during pregnancy 09/06/2016  . Maternal iron deficiency anemia complicating pregnancy in third trimester 06/18/2016  . Indication for care in labor and delivery, antepartum 06/11/2016  . First trimester screening     Augmentation: none Complications: None Intrapartum complications/course:  Date of Delivery:  Delivered By: Larey Days Delivery Type: repeat cesarean section, low transverse incision Anesthesia: epidural Placenta: sponatneous Laceration:  Episiotomy: none Newborn Data: Live born female  Birth Weight: 5 lb 11.4 oz (2590 g) APGAR: 8, 9    Postpartum Procedures: None  Post partum course: (Cesarean Section):  Patient had an uncomplicated postpartum course.  By time of discharge on POD#2, her pain was controlled on oral pain medications; she had appropriate lochia and was ambulating, voiding without difficulty, tolerating regular diet and passing flatus.   She was deemed stable for discharge to home.    Discharge Physical Exam:  BP 117/63 (BP Location: Left Arm)   Pulse 73   Temp 98.2 F (36.8 C) (Oral)   Resp 18   Wt 194 lb (88 kg)   LMP 01/13/2016   SpO2 100%   BMI 30.38 kg/m   General: NAD CV: RRR Pulm:  CTABL, nl effort ABD: s/nd/nt, fundus firm and below the umbilicus Lochia: moderate Incision: c/d/i DVT Evaluation: LE non-ttp, no evidence of DVT on exam.  Hemoglobin  Date Value Ref Range Status  10/05/2016 9.7 (L) 12.0 - 16.0 g/dL Final   HGB  Date Value Ref Range Status  05/09/2012 9.1 (L) 12.0 - 16.0 g/dL Final   HCT  Date Value Ref Range Status  10/05/2016 29.5 (L) 35.0 - 47.0 % Final  02/29/2012 31.6 (L) 35.0 - 47.0 % Final     Disposition: stable, discharge to home. Baby Feeding: b Baby Disposition: home with mom   Contraception: BTL  Prenatal Labs:   O+ , RI ,VZ IMM all other n  Plan:  [name] was discharged to home in good condition. Follow-up appointment at [facility] in 2 weeks   Discharge Medications: Allergies as of 10/06/2016   No Known Allergies     Medication List    TAKE these medications   acetaminophen 325 MG tablet Commonly known as:  TYLENOL Take 2 tablets (650 mg total) by mouth every 4 (four) hours as needed (for pain scale < 4).   ferrous sulfate 325 (65 FE) MG tablet Take 325 mg by mouth daily with breakfast.   ibuprofen 600 MG tablet Commonly known as:  ADVIL,MOTRIN Take 1 tablet (600 mg total) by mouth every 6 (six) hours as needed for mild pain.   multivitamin-prenatal 27-0.8 MG Tabs tablet Take 1 tablet by mouth daily at 12 noon.   oxyCODONE 5 MG immediate release tablet Commonly known as:  Oxy IR/ROXICODONE Take 1 tablet (5 mg total) by mouth every 4 (four) hours as needed (pain scale 4-7).  Follow-up Information    Boykin Nearing, MD. Schedule an appointment as soon as possible for a visit in 2 week(s).   Specialty:  Obstetrics and Gynecology Why:  postop check Contact information: [address] [place] [address] [phone]           Signed: Benjaman Kindler 10/06/16

## 2016-10-05 NOTE — Anesthesia Post-op Follow-up Note (Cosign Needed)
Anesthesia QCDR form completed.        

## 2016-10-05 NOTE — Brief Op Note (Signed)
10/04/2016 - 10/05/2016  12:10 AM  DAte of procedure  [birth year]  PATIENT:  [name]  36 y.o. female  PRE-OPERATIVE DIAGNOSIS:  previous cesarean section; bleeding and dilating Elective sterilization  POST-OPERATIVE DIAGNOSIS:  previous cesarean section; bleeding and dilating Elective sterilization  PROCEDURE:  Procedure(s): CESAREAN SECTION with bilateral tubal ligation (N/A)  SURGEON:  Surgeon(s) and Role:    Boykin Nearing, MD - Primary  PHYSICIAN ASSISTANT: Street , scrub tech   ASSISTANTS: none none  ANESTHESIA:   spinal  EBL:  Total I/O In: 2000 [I.V.:2000] Out: 900 [Urine:100; Blood:800]  BLOOD ADMINISTERED:none  DRAINS: Urinary Catheter (Foley)   LOCAL MEDICATIONS USED:  MARCAINE   , BUPIVICAINE  and Amount: 90 ml Exparel + marcaine + NS  SPECIMEN:  Source of Specimen:  portion of bilateral tubes  DISPOSITION OF SPECIMEN:  PATHOLOGY  COUNTS:  YES  TOURNIQUET:  * No tourniquets in log *  DICTATION: .Other Dictation: Dictation Number verbal  PLAN OF CARE: Admit to inpatient   PATIENT DISPOSITION:  PACU - hemodynamically stable.   Delay start of Pharmacological VTE agent (>24hrs) due to surgical blood loss or risk of bleeding: not applicable

## 2016-10-06 LAB — RPR: RPR: NONREACTIVE

## 2016-10-06 LAB — SURGICAL PATHOLOGY

## 2016-10-06 MED ORDER — OXYCODONE HCL 5 MG PO TABS: 5.0000 mg | ORAL_TABLET | ORAL | 0 refills | Status: DC | PRN

## 2016-10-06 MED ORDER — IBUPROFEN 600 MG PO TABS: 600.0000 mg | ORAL_TABLET | Freq: Four times a day (QID) | ORAL | 0 refills | Status: DC | PRN

## 2016-10-06 MED ORDER — ACETAMINOPHEN 325 MG PO TABS: 650.0000 mg | ORAL_TABLET | ORAL | 0 refills | Status: DC | PRN

## 2016-10-06 NOTE — Progress Notes (Signed)
Pt discharged home with infant.  Discharge instructions and follow up appointment given to and reviewed with pt.  Pt verbalized understanding.  Escorted by auxillary. 

## 2016-10-11 ENCOUNTER — Other Ambulatory Visit: Payer: Managed Care, Other (non HMO)

## 2016-10-12 ENCOUNTER — Inpatient Hospital Stay
Admission: RE | Admit: 2016-10-12 | Payer: Managed Care, Other (non HMO) | Source: Ambulatory Visit | Admitting: Obstetrics and Gynecology

## 2016-10-12 SURGERY — Surgical Case
Anesthesia: Spinal | Laterality: Bilateral

## 2017-01-23 DIAGNOSIS — D509 Iron deficiency anemia, unspecified: Secondary | ICD-10-CM | POA: Insufficient documentation

## 2017-01-23 NOTE — Progress Notes (Deleted)
[facility]  [phone] [phone]  ID: [name] OB: 1980-09-22  MR#: [id number]  PTW#:[id number]  Patient Care Team: Catheryn Bacon, CNM as PCP - General (Obstetrics and Gynecology)  CHIEF COMPLAINT: Maternal iron deficiency anemia.  INTERVAL HISTORY: Patient returns to clinic today for repeat laboratory work, further evaluation and consideration of IV iron. She feels tired, but otherwise well. She has no neurologic complaints. She denies any recent fevers or illnesses. She is gaining weight appropriately. She has no chest pain or shortness of breath. She denies any nausea, vomiting, constipation, or diarrhea. She denies any melena or hematochezia. She has no urinary complaints. Patient feels at her baseline and offers no specific complaints today.  REVIEW OF SYSTEMS:   Review of Systems  Constitutional: Positive for malaise/fatigue. Negative for fever and weight loss.  Respiratory: Negative.  Negative for cough and shortness of breath.   Cardiovascular: Negative.  Negative for chest pain and leg swelling.  Gastrointestinal: Negative.  Negative for abdominal pain, blood in stool and melena.  Genitourinary: Negative.   Musculoskeletal: Negative.   Neurological: Negative.  Negative for weakness.  Psychiatric/Behavioral: Negative.  The patient is not nervous/anxious.     As per HPI. Otherwise, a complete review of systems is negative.  PAST MEDICAL HISTORY: Past Medical History:  Diagnosis Date  . H/O: C-section 2008 and 2011  . MS (multiple sclerosis) ([place]) 2011    PAST SURGICAL HISTORY: Past Surgical History:  Procedure Laterality Date  . CESAREAN SECTION    . CESAREAN SECTION N/A 10/04/2016   Procedure: CESAREAN SECTION with bilateral tubal ligation;  Surgeon: Boykin Nearing, MD;  Location: [facility] ORS;  Service: Obstetrics;  Laterality: N/A;  . LAPAROSCOPY ABDOMEN DIAGNOSTIC      FAMILY HISTORY: Reviewed and unchanged.  No reported history of malignancy or chronic disease.  ADVANCED DIRECTIVES (Y/N):  N  HEALTH MAINTENANCE: Social History  Substance Use Topics  . Smoking status: Never Smoker  . Smokeless tobacco: Never Used  . Alcohol use No     Colonoscopy:  PAP:  Bone density:  Lipid panel:  No Known Allergies  Current Outpatient Prescriptions  Medication Sig Dispense Refill  . acetaminophen (TYLENOL) 325 MG tablet Take 2 tablets (650 mg total) by mouth every 4 (four) hours as needed (for pain scale < 4). 30 tablet 0  . ferrous sulfate 325 (65 FE) MG tablet Take 325 mg by mouth daily with breakfast.    . ibuprofen (ADVIL,MOTRIN) 600 MG tablet Take 1 tablet (600 mg total) by mouth every 6 (six) hours as needed for mild pain. 30 tablet 0  . oxyCODONE (OXY IR/ROXICODONE) 5 MG immediate release tablet Take 1 tablet (5 mg total) by mouth every 4 (four) hours as needed (pain scale 4-7). 15 tablet 0  . Prenatal Vit-Fe Fumarate-FA (MULTIVITAMIN-PRENATAL) 27-0.8 MG TABS tablet Take 1 tablet by mouth daily at 12 noon.     No current facility-administered medications for this visit.     OBJECTIVE: There were no vitals filed for this visit.   There is no height or weight on file to calculate BMI.    ECOG FS:0 - Asymptomatic  General: Well-developed, well-nourished, no acute distress. Eyes: Pink conjunctiva, anicteric sclera. Lungs: Clear to auscultation bilaterally. Heart: Regular rate and rhythm. No rubs, murmurs, or gallops. Abdomen: Appears appropriate for gestational age. Musculoskeletal: No edema, cyanosis, or clubbing. Neuro: Alert, answering all questions appropriately. Cranial nerves grossly intact. Skin: No rashes or petechiae noted. Psych: Normal  affect.   LAB RESULTS:  Lab Results  Component Value Date   NA 139 02/29/2012   K 4.0 02/29/2012   CL 106 02/29/2012   CO2 25 02/29/2012   GLUCOSE 98 02/29/2012   BUN 5 (L) 02/29/2012   CREATININE 0.62 02/29/2012   CALCIUM 8.4 (L)  02/29/2012   PROT 7.4 02/29/2012   ALBUMIN 3.0 (L) 02/29/2012   AST 17 02/29/2012   ALT 13 02/29/2012   ALKPHOS 70 02/29/2012   BILITOT 0.2 02/29/2012   GFRNONAA >60 02/29/2012   GFRAA >60 02/29/2012    Lab Results  Component Value Date   WBC 11.9 (H) 10/05/2016   NEUTROABS 4.8 09/21/2016   HGB 9.7 (L) 10/05/2016   HCT 29.5 (L) 10/05/2016   MCV 88.2 10/05/2016   PLT 162 10/05/2016   Lab Results  Component Value Date   IRON 100 09/21/2016   TIBC 349 09/21/2016   IRONPCTSAT 29 09/21/2016   Lab Results  Component Value Date   FERRITIN 46 09/21/2016     STUDIES: No results found.  ASSESSMENT: Maternal iron deficiency anemia.  PLAN:    1. Maternal iron deficiency anemia: Patient's hemoglobin has significantly improved and her iron stores are now within normal limits. Previously, the remainder of her blood work was either negative or within normal limits. She does not require 510mg  IV Feraheme today. Patient has been instructed to continue oral iron supplementation as directed. Return to clinic in 4 months for repeat laboratory work and further evaluation. 2. Pregnancy: Patient is having a scheduled C-section on Oct 12, 2016.  Patient expressed understanding and was in agreement with this plan. She also understands that She can call clinic at any time with any questions, concerns, or complaints.    Lloyd Huger, MD   01/23/2017 9:44 PM

## 2017-01-25 ENCOUNTER — Inpatient Hospital Stay: Payer: 59

## 2017-01-25 ENCOUNTER — Inpatient Hospital Stay: Payer: 59 | Admitting: Oncology

## 2019-08-06 ENCOUNTER — Other Ambulatory Visit: Payer: Self-pay | Admitting: Neurology

## 2019-08-06 DIAGNOSIS — G35 Multiple sclerosis: Secondary | ICD-10-CM

## 2019-08-15 ENCOUNTER — Ambulatory Visit
Admission: RE | Admit: 2019-08-15 | Discharge: 2019-08-15 | Disposition: A | Discharge status: Home / Self Care | Payer: Managed Care, Other (non HMO) | Source: Ambulatory Visit | Attending: Neurology | Admitting: Neurology

## 2019-08-15 ENCOUNTER — Other Ambulatory Visit: Payer: Self-pay

## 2019-08-15 DIAGNOSIS — G35 Multiple sclerosis: Secondary | ICD-10-CM

## 2019-08-15 MED ORDER — GADOBUTROL 1 MMOL/ML IV SOLN
9.0000 mL | Freq: Once | INTRAVENOUS | Status: AC | PRN
  Administered 2019-08-15: 15:00:00 9 mL via INTRAVENOUS

## 2019-08-22 ENCOUNTER — Other Ambulatory Visit: Payer: Self-pay | Admitting: Neurosurgery

## 2019-08-29 ENCOUNTER — Encounter
Admission: RE | Admit: 2019-08-29 | Discharge: 2019-08-29 | Disposition: A | Discharge status: Home / Self Care | Payer: Managed Care, Other (non HMO) | Source: Ambulatory Visit | Attending: Neurosurgery | Admitting: Neurosurgery

## 2019-08-29 ENCOUNTER — Other Ambulatory Visit: Payer: Self-pay

## 2019-08-29 HISTORY — DX: Headache, unspecified: R51.9

## 2019-08-29 HISTORY — DX: Anemia, unspecified: D64.9

## 2019-08-29 NOTE — Patient Instructions (Signed)
Your procedure is scheduled on: Wed. 4/7 Report to [facility] To find out your arrival time please call [phone] between 1PM - 3PM on .Tues 4/6  Remember: Instructions that are not followed completely may result in serious medical risk,  up to and including death, or upon the discretion of your surgeon and anesthesiologist your  surgery may need to be rescheduled.     _X__ 1. Do not eat food after midnight the night before your procedure.                 No gum chewing or hard candies. You may drink clear liquids up to 2 hours                 before you are scheduled to arrive for your surgery- DO not drink clear                 liquids within 2 hours of the start of your surgery.                 Clear Liquids include:  water, apple juice without pulp, clear Gatorade, G2 or                  Gatorade Zero (avoid Red/Purple/Blue), Black Coffee or Tea (Do not add                 anything to coffee or tea). _____2.   Complete the carbohydrate drink provided to you, 2 hours before arrival.  __X__2.  On the morning of surgery brush your teeth with toothpaste and water, you                may rinse your mouth with mouthwash if you wish.  Do not swallow any toothpaste of mouthwash.     ___ 3.  No Alcohol for 24 hours before or after surgery.   ___ 4.  Do Not Smoke or use e-cigarettes For 24 Hours Prior to Your Surgery.                 Do not use any chewable tobacco products for at least 6 hours prior to                 surgery.  ____  5.  Bring all medications with you on the day of surgery if instructed.   _x___  6.  Notify your doctor if there is any change in your medical condition      (cold, fever, infections).     Do not wear jewelry, make-up, hairpins, clips or nail polish. Do not wear lotions, powders, or perfumes. Do not shave 48 hours prior to surgery.  Do not bring valuables to the hospital.    [facility] is not responsible for any belongings  or valuables.  Contacts, dentures or bridgework may not be worn into surgery. Leave your suitcase in the car. After surgery it may be brought to your room. For patients admitted to the hospital, discharge time is determined by your treatment team.   Patients discharged the day of surgery will not be allowed to drive home.   Make arrangements for someone to be with you for the first 24 hours of your Same Day Discharge.    Please read over the following fact sheets that you were given:     __x__ Take these medicines the morning of surgery with A SIP OF WATER:    1. acetaminophen (TYLENOL) 325 MG tablet if needed  2. Dimethyl Fumarate 240 MG CPDR  3.   4.  5.  6.  ____ Fleet Enema (as directed)   _x___ Use CHG Soap (or wipes) as directed  ____ Use Benzoyl Peroxide Gel as instructed  ____ Use inhalers on the day of surgery  ____ Stop metformin 2 days prior to surgery    ____ Take 1/2 of usual insulin dose the night before surgery. No insulin the morning          of surgery.   __x__ Stop aspirin on 3/31  __x__ Stop Anti-inflammatories ibuprofen (ADVIL) 200 MG tablet, aleve, or aspirin products on 3/31   ____ Stop supplements until after surgery.    ____ Bring C-Pap to the hospital.

## 2019-09-08 ENCOUNTER — Encounter
Admission: RE | Admit: 2019-09-08 | Discharge: 2019-09-08 | Disposition: A | Discharge status: Home / Self Care | Payer: Managed Care, Other (non HMO) | Source: Ambulatory Visit | Attending: Neurosurgery | Admitting: Neurosurgery

## 2019-09-08 ENCOUNTER — Other Ambulatory Visit
Admission: RE | Admit: 2019-09-08 | Discharge: 2019-09-08 | Disposition: A | Discharge status: Home / Self Care | Payer: Managed Care, Other (non HMO) | Source: Ambulatory Visit | Attending: Neurosurgery | Admitting: Neurosurgery

## 2019-09-08 ENCOUNTER — Other Ambulatory Visit: Payer: Managed Care, Other (non HMO)

## 2019-09-08 ENCOUNTER — Other Ambulatory Visit: Payer: Self-pay

## 2019-09-08 DIAGNOSIS — Z01812 Encounter for preprocedural laboratory examination: Secondary | ICD-10-CM | POA: Diagnosis present

## 2019-09-08 DIAGNOSIS — Z20822 Contact with and (suspected) exposure to covid-19: Secondary | ICD-10-CM | POA: Diagnosis not present

## 2019-09-08 LAB — TYPE AND SCREEN
ABO/RH(D): O POS
Antibody Screen: NEGATIVE

## 2019-09-08 LAB — URINALYSIS, ROUTINE W REFLEX MICROSCOPIC
Bilirubin Urine: NEGATIVE
Glucose, UA: NEGATIVE mg/dL
Ketones, ur: NEGATIVE mg/dL
Nitrite: POSITIVE — AB
Protein, ur: NEGATIVE mg/dL
Specific Gravity, Urine: 1.026 (ref 1.005–1.030)
pH: 5 (ref 5.0–8.0)

## 2019-09-08 LAB — BASIC METABOLIC PANEL
Anion gap: 7 (ref 5–15)
BUN: 8 mg/dL (ref 6–20)
CO2: 24 mmol/L (ref 22–32)
Calcium: 8.9 mg/dL (ref 8.9–10.3)
Chloride: 108 mmol/L (ref 98–111)
Creatinine, Ser: 0.6 mg/dL (ref 0.44–1.00)
GFR calc Af Amer: >60 mL/min (ref >60)
GFR calc non Af Amer: >60 mL/min (ref >60)
Glucose, Bld: 88 mg/dL (ref 70–99)
Potassium: 3.5 mmol/L (ref 3.5–5.1)
Sodium: 139 mmol/L (ref 135–145)

## 2019-09-08 LAB — CBC
HCT: 38.5 % (ref 36.0–46.0)
Hemoglobin: 12 g/dL (ref 12.0–15.0)
MCH: 27.3 pg (ref 26.0–34.0)
MCHC: 31.2 g/dL (ref 30.0–36.0)
MCV: 87.7 fL (ref 80.0–100.0)
Platelets: 292 10*3/uL (ref 150–400)
RBC: 4.39 MIL/uL (ref 3.87–5.11)
RDW: 14 % (ref 11.5–15.5)
WBC: 5.2 10*3/uL (ref 4.0–10.5)
nRBC: 0 % (ref 0.0–0.2)

## 2019-09-08 LAB — SURGICAL PCR SCREEN
MRSA, PCR: NEGATIVE
Staphylococcus aureus: NEGATIVE

## 2019-09-08 LAB — APTT: aPTT: 31 seconds (ref 24–36)

## 2019-09-08 LAB — PROTIME-INR
INR: 1 (ref 0.8–1.2)
Prothrombin Time: 12.6 seconds (ref 11.4–15.2)

## 2019-09-08 NOTE — Pre-Procedure Instructions (Signed)
Pre-Admit Testing Provider Notification Note  Provider Notified: Dr. Izora Ribas  Notification Mode: Fax  Reason: Abnormal UA  Response: Fax confirmation received.  Additional Information: Placed on chart. Noted on Pre-Admit Worksheet.  Signed: Beulah Gandy, RN

## 2019-09-09 LAB — SARS CORONAVIRUS 2 (TAT 6-24 HRS): SARS Coronavirus 2: NEGATIVE

## 2019-09-10 ENCOUNTER — Ambulatory Visit: Payer: Managed Care, Other (non HMO)

## 2019-09-10 ENCOUNTER — Ambulatory Visit
Admission: RE | Admit: 2019-09-10 | Discharge: 2019-09-10 | Disposition: A | Discharge status: Home / Self Care | Payer: Managed Care, Other (non HMO) | Attending: Neurosurgery | Admitting: Neurosurgery

## 2019-09-10 ENCOUNTER — Other Ambulatory Visit: Payer: Self-pay

## 2019-09-10 ENCOUNTER — Ambulatory Visit: Payer: Managed Care, Other (non HMO) | Admitting: Anesthesiology

## 2019-09-10 ENCOUNTER — Encounter
Admission: RE | Disposition: A | Discharge status: Home / Self Care | Payer: Self-pay | Source: Home / Self Care | Attending: Neurosurgery

## 2019-09-10 ENCOUNTER — Encounter: Payer: Self-pay | Admitting: Neurosurgery

## 2019-09-10 DIAGNOSIS — Z791 Long term (current) use of non-steroidal anti-inflammatories (NSAID): Secondary | ICD-10-CM | POA: Insufficient documentation

## 2019-09-10 DIAGNOSIS — M50022 Cervical disc disorder at C5-C6 level with myelopathy: Secondary | ICD-10-CM | POA: Insufficient documentation

## 2019-09-10 DIAGNOSIS — Z981 Arthrodesis status: Secondary | ICD-10-CM

## 2019-09-10 DIAGNOSIS — G35 Multiple sclerosis: Secondary | ICD-10-CM | POA: Diagnosis not present

## 2019-09-10 DIAGNOSIS — M4802 Spinal stenosis, cervical region: Secondary | ICD-10-CM | POA: Insufficient documentation

## 2019-09-10 DIAGNOSIS — Z419 Encounter for procedure for purposes other than remedying health state, unspecified: Secondary | ICD-10-CM

## 2019-09-10 DIAGNOSIS — G959 Disease of spinal cord, unspecified: Secondary | ICD-10-CM | POA: Diagnosis present

## 2019-09-10 HISTORY — PX: CERVICAL DISC ARTHROPLASTY: SHX587

## 2019-09-10 LAB — POCT PREGNANCY, URINE: Preg Test, Ur: NEGATIVE

## 2019-09-10 SURGERY — CERVICAL ANTERIOR DISC ARTHROPLASTY
Anesthesia: General

## 2019-09-10 MED ORDER — ACETAMINOPHEN 10 MG/ML IV SOLN
INTRAVENOUS | Status: AC
  Filled 2019-09-10: qty 100

## 2019-09-10 MED ORDER — REMIFENTANIL HCL 1 MG IV SOLR
INTRAVENOUS | Status: AC
  Filled 2019-09-10: qty 1000

## 2019-09-10 MED ORDER — PROPOFOL 10 MG/ML IV BOLUS
INTRAVENOUS | Status: AC
  Filled 2019-09-10: qty 20

## 2019-09-10 MED ORDER — BUPIVACAINE-EPINEPHRINE (PF) 0.5% -1:200000 IJ SOLN
INTRAMUSCULAR | Status: DC | PRN
  Administered 2019-09-10: 3 mL

## 2019-09-10 MED ORDER — LACTATED RINGERS IV SOLN: INTRAVENOUS | Status: DC

## 2019-09-10 MED ORDER — FAMOTIDINE 20 MG PO TABS
ORAL_TABLET | ORAL | Status: AC
  Administered 2019-09-10: 20 mg via ORAL
  Filled 2019-09-10: qty 1

## 2019-09-10 MED ORDER — OXYCODONE HCL 5 MG PO TABS
ORAL_TABLET | ORAL | Status: AC
  Filled 2019-09-10: qty 1

## 2019-09-10 MED ORDER — FAMOTIDINE 20 MG PO TABS: 20.0000 mg | ORAL_TABLET | Freq: Once | ORAL | Status: AC

## 2019-09-10 MED ORDER — PROPOFOL 500 MG/50ML IV EMUL
INTRAVENOUS | Status: DC | PRN
  Administered 2019-09-10: 180 ug/kg/min via INTRAVENOUS

## 2019-09-10 MED ORDER — THROMBIN 5000 UNITS EX SOLR
CUTANEOUS | Status: DC | PRN
  Administered 2019-09-10: 5000 [IU] via TOPICAL

## 2019-09-10 MED ORDER — DEXAMETHASONE SODIUM PHOSPHATE 10 MG/ML IJ SOLN
INTRAMUSCULAR | Status: DC | PRN
  Administered 2019-09-10: 4 mg via INTRAVENOUS

## 2019-09-10 MED ORDER — CEFAZOLIN SODIUM-DEXTROSE 2-4 GM/100ML-% IV SOLN
INTRAVENOUS | Status: AC
  Filled 2019-09-10: qty 100

## 2019-09-10 MED ORDER — OXYCODONE HCL 5 MG PO TABS: 5.0000 mg | ORAL_TABLET | ORAL | 0 refills | Status: AC | PRN

## 2019-09-10 MED ORDER — METHOCARBAMOL 500 MG PO TABS
500.0000 mg | ORAL_TABLET | Freq: Once | ORAL | Status: AC
  Administered 2019-09-10: 12:00:00 500 mg via ORAL

## 2019-09-10 MED ORDER — FENTANYL CITRATE (PF) 100 MCG/2ML IJ SOLN
INTRAMUSCULAR | Status: AC
  Filled 2019-09-10: qty 2

## 2019-09-10 MED ORDER — PROPOFOL 10 MG/ML IV BOLUS
INTRAVENOUS | Status: DC | PRN
  Administered 2019-09-10: 200 mg via INTRAVENOUS

## 2019-09-10 MED ORDER — SODIUM CHLORIDE 0.9 % IV SOLN
INTRAVENOUS | Status: DC | PRN
  Administered 2019-09-10: 09:00:00 30 ug/min via INTRAVENOUS

## 2019-09-10 MED ORDER — FENTANYL CITRATE (PF) 100 MCG/2ML IJ SOLN
25.0000 ug | INTRAMUSCULAR | Status: DC | PRN
  Administered 2019-09-10 (×2): 50 ug via INTRAVENOUS

## 2019-09-10 MED ORDER — LACTATED RINGERS IV SOLN: INTRAVENOUS | Status: DC | PRN

## 2019-09-10 MED ORDER — OXYCODONE HCL 5 MG PO TABS: 5.0000 mg | ORAL_TABLET | Freq: Once | ORAL | Status: DC | PRN

## 2019-09-10 MED ORDER — METHOCARBAMOL 500 MG PO TABS: 500.0000 mg | ORAL_TABLET | Freq: Four times a day (QID) | ORAL | 0 refills | Status: AC | PRN

## 2019-09-10 MED ORDER — PROPOFOL 500 MG/50ML IV EMUL
INTRAVENOUS | Status: AC
  Filled 2019-09-10: qty 50

## 2019-09-10 MED ORDER — CEFAZOLIN SODIUM-DEXTROSE 2-4 GM/100ML-% IV SOLN
2.0000 g | INTRAVENOUS | Status: AC
  Administered 2019-09-10: 2 g via INTRAVENOUS

## 2019-09-10 MED ORDER — FENTANYL CITRATE (PF) 100 MCG/2ML IJ SOLN
INTRAMUSCULAR | Status: DC | PRN
  Administered 2019-09-10: 100 ug via INTRAVENOUS

## 2019-09-10 MED ORDER — REMIFENTANIL HCL 1 MG IV SOLR
INTRAVENOUS | Status: DC | PRN
  Administered 2019-09-10: .2 ug/kg/min via INTRAVENOUS

## 2019-09-10 MED ORDER — DEXMEDETOMIDINE HCL 200 MCG/2ML IV SOLN
INTRAVENOUS | Status: DC | PRN
  Administered 2019-09-10: 8 ug via INTRAVENOUS
  Administered 2019-09-10: 4 ug via INTRAVENOUS

## 2019-09-10 MED ORDER — ONDANSETRON HCL 4 MG/2ML IJ SOLN
INTRAMUSCULAR | Status: DC | PRN
  Administered 2019-09-10: 4 mg via INTRAVENOUS

## 2019-09-10 MED ORDER — SODIUM CHLORIDE 0.9 % IR SOLN
Status: DC | PRN
  Administered 2019-09-10: 1000 mL

## 2019-09-10 MED ORDER — SODIUM CHLORIDE FLUSH 0.9 % IV SOLN
INTRAVENOUS | Status: AC
  Filled 2019-09-10: qty 10

## 2019-09-10 MED ORDER — METHOCARBAMOL 500 MG PO TABS
ORAL_TABLET | ORAL | Status: AC
  Filled 2019-09-10: qty 1

## 2019-09-10 MED ORDER — OXYCODONE HCL 5 MG PO TABS
5.0000 mg | ORAL_TABLET | ORAL | Status: DC | PRN
  Administered 2019-09-10: 13:00:00 5 mg via ORAL
  Filled 2019-09-10 (×2): qty 1

## 2019-09-10 MED ORDER — MIDAZOLAM HCL 5 MG/5ML IJ SOLN
INTRAMUSCULAR | Status: DC | PRN
  Administered 2019-09-10: 2 mg via INTRAVENOUS

## 2019-09-10 MED ORDER — MIDAZOLAM HCL 2 MG/2ML IJ SOLN
INTRAMUSCULAR | Status: AC
  Filled 2019-09-10: qty 2

## 2019-09-10 MED ORDER — SUCCINYLCHOLINE CHLORIDE 20 MG/ML IJ SOLN
INTRAMUSCULAR | Status: DC | PRN
  Administered 2019-09-10: 120 mg via INTRAVENOUS

## 2019-09-10 MED ORDER — OXYCODONE HCL 5 MG/5ML PO SOLN: 5.0000 mg | Freq: Once | ORAL | Status: DC | PRN

## 2019-09-10 MED ORDER — LIDOCAINE HCL (CARDIAC) PF 100 MG/5ML IV SOSY
PREFILLED_SYRINGE | INTRAVENOUS | Status: DC | PRN
  Administered 2019-09-10: 40 mg via INTRAVENOUS
  Administered 2019-09-10: 60 mg via INTRAVENOUS

## 2019-09-10 MED ORDER — ACETAMINOPHEN 10 MG/ML IV SOLN
INTRAVENOUS | Status: DC | PRN
  Administered 2019-09-10: 1000 mg via INTRAVENOUS

## 2019-09-10 SURGICAL SUPPLY — 53 items
BUR NEURO DRILL SOFT 3.0X3.8M (BURR) ×3 IMPLANT
CANISTER SUCT 1200ML W/VALVE (MISCELLANEOUS) ×6 IMPLANT
CHLORAPREP W/TINT 26 (MISCELLANEOUS) ×6 IMPLANT
COUNTER NEEDLE 20/40 LG (NEEDLE) ×3 IMPLANT
COVER LIGHT HANDLE STERIS (MISCELLANEOUS) ×6 IMPLANT
COVER WAND RF STERILE (DRAPES) ×3 IMPLANT
CRADLE LAMINECT ARM (MISCELLANEOUS) ×3 IMPLANT
DERMABOND ADVANCED (GAUZE/BANDAGES/DRESSINGS) ×2
DERMABOND ADVANCED .7 DNX12 (GAUZE/BANDAGES/DRESSINGS) ×1 IMPLANT
DISC MOBI-C CERVICAL 13X15 H5 (Miscellaneous) ×2 IMPLANT
DRAPE C-ARM 42X72 X-RAY (DRAPES) ×6 IMPLANT
DRAPE LAPAROTOMY 77X122 PED (DRAPES) ×3 IMPLANT
DRAPE MICROSCOPE SPINE 48X150 (DRAPES) ×3 IMPLANT
DRAPE SURG 17X11 SM STRL (DRAPES) ×12 IMPLANT
ELECT CAUTERY BLADE TIP 2.5 (TIP) ×3
ELECT REM PT RETURN 9FT ADLT (ELECTROSURGICAL) ×3
ELECTRODE CAUTERY BLDE TIP 2.5 (TIP) ×1 IMPLANT
ELECTRODE REM PT RTRN 9FT ADLT (ELECTROSURGICAL) ×1 IMPLANT
FEE INTRAOP MONITOR IMPULS NCS (MISCELLANEOUS) IMPLANT
FRAME EYE SHIELD (PROTECTIVE WEAR) ×4 IMPLANT
GLOVE BIOGEL PI IND STRL 7.0 (GLOVE) ×1 IMPLANT
GLOVE BIOGEL PI INDICATOR 7.0 (GLOVE) ×2
GLOVE SURG SYN 7.0 (GLOVE) ×6 IMPLANT
GLOVE SURG SYN 7.0 PF PI (GLOVE) ×2 IMPLANT
GLOVE SURG SYN 8.5  E (GLOVE) ×6
GLOVE SURG SYN 8.5 E (GLOVE) ×3 IMPLANT
GLOVE SURG SYN 8.5 PF PI (GLOVE) ×3 IMPLANT
GOWN SRG XL LVL 3 NONREINFORCE (GOWNS) ×1 IMPLANT
GOWN STRL NON-REIN TWL XL LVL3 (GOWNS) ×2
GOWN STRL REUS W/TWL MED LVL3 (GOWN DISPOSABLE) ×3 IMPLANT
GRADUATE 1200CC STRL 31836 (MISCELLANEOUS) ×3 IMPLANT
INTRAOP MONITOR FEE IMPULS NCS (MISCELLANEOUS) ×1
INTRAOP MONITOR FEE IMPULSE (MISCELLANEOUS) ×2
KIT TURNOVER KIT A (KITS) ×3 IMPLANT
MARKER SKIN DUAL TIP RULER LAB (MISCELLANEOUS) ×5 IMPLANT
NDL SAFETY ECLIPSE 18X1.5 (NEEDLE) IMPLANT
NEEDLE HYPO 18GX1.5 SHARP (NEEDLE)
NEEDLE HYPO 22GX1.5 SAFETY (NEEDLE) ×3 IMPLANT
NS IRRIG 1000ML POUR BTL (IV SOLUTION) ×3 IMPLANT
PACK LAMINECTOMY NEURO (CUSTOM PROCEDURE TRAY) ×3 IMPLANT
PIN CASPAR SPINAL 12MM (PIN) ×2 IMPLANT
SPOGE SURGIFLO 8M (HEMOSTASIS) ×2
SPONGE KITTNER 5P (MISCELLANEOUS) ×3 IMPLANT
SPONGE SURGIFLO 8M (HEMOSTASIS) ×1 IMPLANT
STAPLER SKIN PROX 35W (STAPLE) IMPLANT
SUT V-LOC 90 ABS DVC 3-0 CL (SUTURE) ×3 IMPLANT
SUT VIC AB 3-0 SH 8-18 (SUTURE) ×3 IMPLANT
SYR 30ML LL (SYRINGE) ×3 IMPLANT
TAPE CLOTH 3X10 WHT NS LF (GAUZE/BANDAGES/DRESSINGS) ×3 IMPLANT
TOWEL OR 17X26 4PK STRL BLUE (TOWEL DISPOSABLE) ×9 IMPLANT
TRAY FOLEY MTR SLVR 16FR STAT (SET/KITS/TRAYS/PACK) IMPLANT
TUBING CONNECTING 10 (TUBING) ×2 IMPLANT
TUBING CONNECTING 10' (TUBING) ×1

## 2019-09-10 NOTE — OR Nursing (Signed)
Pt to xray from postop via wheelchair for DG Cervical spine order prior to discharge.

## 2019-09-10 NOTE — Progress Notes (Addendum)
Pt alert and oriented, able to ambulate to restroom with standby assist, grips equal, pain controlled. Pt tolerated soup and potatoes with no swallowing issues. VSS. Discharge instructions reviewed with patient and Fatima Sanger. Per Randolm Idol, pt may discharge at 2:30pm if stable.

## 2019-09-10 NOTE — Discharge Instructions (Addendum)
Your surgeon has performed an operation on your cervical spine (neck) to relieve pressure on the spinal cord and/or nerves. This involved making an incision in the front of your neck and removing one or more of the discs that support your spine. Next, a small piece of bone, a titanium plate, and screws were used to fuse two or more of the vertebrae (bones) together.  The following are instructions to help in your recovery once you have been discharged from the hospital. Even if you feel well, it is important that you follow these activity guidelines. If you do not let your neck heal properly from the surgery, you can increase the chance of return of your symptoms and other complications.  * You may take ibuprofen, tylenol, muscle relaxer, and pain medication as needed for post op pain management  Activity    No bending, lifting, or twisting ("BLT"). Avoid lifting objects heavier than 10 pounds (gallon milk jug).  Where possible, avoid household activities that involve lifting, bending, reaching, pushing, or pulling such as laundry, vacuuming, grocery shopping, and childcare. Try to arrange for help from friends and family for these activities while your back heals.  Increase physical activity slowly as tolerated.  Taking short walks is encouraged, but avoid strenuous exercise. Do not jog, run, bicycle, lift weights, or participate in any other exercises unless specifically allowed by your doctor.  Talk to your doctor before resuming sexual activity.  You should not drive until cleared by your doctor.  Until released by your doctor, you should not return to work or school.  You should rest at home and let your body heal.   You may shower three days after your surgery.  After showering, lightly dab your incision dry. Do not take a tub bath or go swimming until approved by your doctor at your follow-up appointment.  If your doctor ordered a cervical collar (neck brace) for you, you should wear it  whenever you are out of bed. You may remove it when lying down or sleeping, but you should wear it at all other times. Not all neck surgeries require a cervical collar.  If you smoke, we strongly recommend that you quit.  Smoking has been proven to interfere with normal bone healing and will dramatically reduce the success rate of your surgery. Please contact [facility] (800-QUIT-NOW) and use the resources at www.[facility].com for assistance in stopping smoking.  Surgical Incision   If you have a dressing on your incision, you may remove it two days after your surgery. Keep your incision area clean and dry.  If you have staples or stitches on your incision, you should have a follow up scheduled for removal. If you do not have staples or stitches, you will have steri-strips (small pieces of surgical tape) or Dermabond glue. The steri-strips/glue should begin to peel away within about a week (it is fine if the steri-strips fall off before then). If the strips are still in place one week after your surgery, you may gently remove them.  Diet           You may return to your usual diet. However, you may experience discomfort when swallowing in the first month after your surgery. This is normal. You may find that softer foods are more comfortable for you to swallow. Be sure to stay hydrated.  When to Contact us  You may experience pain in your neck and/or pain between your shoulder blades. This is normal and should improve in the next  few weeks with the help of pain medication, muscle relaxers, and rest. Some patients report that a warm compress on the back of the neck or between the shoulder blades helps.  However, should you experience any of the following, contact us immediately: . New numbness or weakness . Pain that is progressively getting worse, and is not relieved by your pain medication, muscle relaxers, rest, and warm compresses . Bleeding, redness, swelling, pain, or drainage from surgical  incision . Chills or flu-like symptoms . Fever greater than 101.0 F (38.3 C) . Inability to eat, drink fluids, or take medications . Problems with bowel or bladder functions . Difficulty breathing or shortness of breath . Warmth, tenderness, or swelling in your calf Contact Information . During office hours (Monday-Friday 9 am to 5 pm), please call your physician at [phone] and ask for Berdine Addison . After hours and weekends, please call [phone] and an answering service will put you in touch with either Dr. Lacinda Axon or Dr. Izora Ribas.  . For a life-threatening emergency, call [place]   1) The drugs that you were given will stay in your system until tomorrow so for the next 24 hours you should not:  A) Drive an automobile B) Make any legal decisions C) Drink any alcoholic beverage   2) You may resume regular meals tomorrow.  Today it is better to start with liquids and gradually work up to solid foods.  You may eat anything you prefer, but it is better to start with liquids, then soup and crackers, and gradually work up to solid foods.   3) Please notify your doctor immediately if you have any unusual bleeding, trouble breathing, redness and pain at the surgery site, drainage, fever, or pain not relieved by medication.    4) Additional Instructions:        Please contact your physician with any problems or Same Day Surgery at [phone], Monday through Friday 6 am to 4 pm, or [place] at [facility] number at [phone].

## 2019-09-10 NOTE — Progress Notes (Signed)
Pharmacy consult for Cefazolin for surgical prophylaxis  39 yo female Wt 92.1 kg  Will order Cefazolin 2 gm IV pre-op x1  Chinita Greenland PharmD Clinical Pharmacist 09/10/2019

## 2019-09-10 NOTE — Transfer of Care (Signed)
Immediate Anesthesia Transfer of Care Note  Patient: [name]  Procedure(s) Performed: CERVICAL ANTERIOR DISC ARTHROPLASTY C5-6 (N/A )  Patient Location: PACU  Anesthesia Type:General  Level of Consciousness: awake and drowsy  Airway & Oxygen Therapy: Patient Spontanous Breathing and Patient connected to face mask oxygen  Post-op Assessment: Report given to RN and Post -op Vital signs reviewed and stable  Post vital signs: Reviewed and stable  Last Vitals:  Vitals Value Taken Time  BP    Temp    Pulse 85 09/10/19 1032  Resp    SpO2 100 % 09/10/19 1032  Vitals shown include unvalidated device data.  Last Pain:  Vitals:   09/10/19 0724  TempSrc: Temporal  PainSc: 0-No pain         Complications: No apparent anesthesia complications

## 2019-09-10 NOTE — H&P (Signed)
  I have reviewed and confirmed my history and physical from 08/21/2019 with no additions or changes. Plan for C5-6 arthroplasty.  Risks and benefits reviewed.  Heart sounds normal no MRG. Chest Clear to Auscultation Bilaterally.

## 2019-09-10 NOTE — Discharge Summary (Signed)
Procedure: C5-6 arthroplasty Procedure date: 09/10/2019 Diagnosis: Cervical myelopathy   History: [name] is s/p C5-6 arthroplasty for cervical myelopathy  POD0: Tolerated procedure well. Evaluated in post op recovery still disoriented from anesthesia but able to answer questions and obey commands.  Complains of posterior and anterior cervical pain.  Left upper extremity "feels better".  Denies any new upper or lower extremity pain/numbness/tingling  Physical Exam: Vitals:   09/10/19 0724  Pulse: 84  Resp: 18  Temp: 97.9 F (36.6 C)  SpO2: 100%    Strength: 5/5 throughout upper and lower extremities Sensation: intact and symmetric throughout upper and lower extremities Skin: Glue intact at incision site  Data:  Recent Labs  Lab 09/08/19 1227  NA 139  K 3.5  CL 108  CO2 24  BUN 8  CREATININE 0.60  GLUCOSE 88  CALCIUM 8.9   No results for input(s): AST, ALT, ALKPHOS in the last 168 hours.  Invalid input(s): TBILI   Recent Labs  Lab 09/08/19 1227  WBC 5.2  HGB 12.0  HCT 38.5  PLT 292   Recent Labs  Lab 09/08/19 1227  APTT 31  INR 1.0         Other tests/results: Cervical x-rays pending  Assessment/Plan:  [name] is POD0 s/p C5-6 arthroplasty for cervical myelopathy.  Ambulating and voiding without issue.  No issues with swallowing noted.  Left upper extremity symptoms remain resolved.  Denies any new upper or lower extremity complaints.  We will continue postop pain control with Tylenol, ibuprofen, muscle relaxer, and pain medication as needed.  She is scheduled to follow-up in clinic in approximately 2 weeks to monitor progress.   Marin Olp PA-C Department of Neurosurgery

## 2019-09-10 NOTE — Anesthesia Preprocedure Evaluation (Signed)
Anesthesia Evaluation  Patient identified by MRN, date of birth, ID band Patient awake    Reviewed: Allergy & Precautions, H&P , NPO status , Patient's Chart, lab work & pertinent test results  History of Anesthesia Complications Negative for: history of anesthetic complications  Airway Mallampati: I  TM Distance: >3 FB Neck ROM: full    Dental  (+) Chipped   Pulmonary neg pulmonary ROS, neg shortness of breath,           Cardiovascular Exercise Tolerance: Good (-) angina(-) Past MI and (-) DOE negative cardio ROS       Neuro/Psych  Headaches, negative psych ROS   GI/Hepatic negative GI ROS, Neg liver ROS,   Endo/Other  negative endocrine ROS  Renal/GU      Musculoskeletal   Abdominal   Peds  Hematology negative hematology ROS (+)   Anesthesia Other Findings Past Medical History: No date: Anemia 2008 and 2011: H/O: C-section No date: Headache 2011: MS (multiple sclerosis) ([place])  Past Surgical History: No date: CESAREAN SECTION 10/04/2016: CESAREAN SECTION; N/A     Comment:  Procedure: CESAREAN SECTION with bilateral tubal               ligation;  Surgeon: Boykin Nearing, MD;  Location:              [facility] ORS;  Service: Obstetrics;  Laterality: N/A; No date: CESAREAN SECTION No date: LAPAROSCOPY ABDOMEN DIAGNOSTIC  BMI    Body Mass Index: 32.60 kg/m      Reproductive/Obstetrics negative OB ROS                             Anesthesia Physical Anesthesia Plan  ASA: III  Anesthesia Plan: General ETT   Post-op Pain Management:    Induction: Intravenous  PONV Risk Score and Plan: Ondansetron, Dexamethasone, Midazolam and Treatment may vary due to age or medical condition  Airway Management Planned: Oral ETT and Video Laryngoscope Planned  Additional Equipment:   Intra-op Plan:   Post-operative Plan: Extubation in OR  Informed Consent: I have reviewed the  patients History and Physical, chart, labs and discussed the procedure including the risks, benefits and alternatives for the proposed anesthesia with the patient or authorized representative who has indicated his/her understanding and acceptance.     Dental Advisory Given  Plan Discussed with: Anesthesiologist, CRNA and Surgeon  Anesthesia Plan Comments: (Patient consented that surgery and or anesthesia could lead to a MS flare.  She voiced understanding.  Patient consented for risks of anesthesia including but not limited to:  - adverse reactions to medications - damage to teeth, lips or other oral mucosa - sore throat or hoarseness - Damage to heart, brain, lungs or loss of life  Patient voiced understanding.)        Anesthesia Quick Evaluation

## 2019-09-10 NOTE — Anesthesia Procedure Notes (Signed)
Procedure Name: Intubation Performed by: Jonna Clark, CRNA Pre-anesthesia Checklist: Patient identified, Patient being monitored, Timeout performed, Emergency Drugs available and Suction available Patient Re-evaluated:Patient Re-evaluated prior to induction Oxygen Delivery Method: Circle system utilized Preoxygenation: Pre-oxygenation with 100% oxygen Induction Type: IV induction Ventilation: Mask ventilation without difficulty Laryngoscope Size: 3 and McGraph Grade View: Grade I Tube type: Oral Tube size: 7.0 mm Number of attempts: 1 Airway Equipment and Method: Stylet Placement Confirmation: ETT inserted through vocal cords under direct vision,  positive ETCO2 and breath sounds checked- equal and bilateral Secured at: 21 cm Tube secured with: Tape Dental Injury: Teeth and Oropharynx as per pre-operative assessment  Comments: Head and neck neutral throughout intubation

## 2019-09-10 NOTE — Op Note (Signed)
Indications: [name] is a 39 yo female who presented with progressive cervical myelopathy.  Due to ongoing and worsening symptoms, surgical intervention was advised.  Findings: large C5-6 disc herniation  Preoperative Diagnosis: Cervical myelopathy G95.9 Postoperative Diagnosis: same   EBL: 50 ml IVF: 700 ml Drains: none Disposition: Extubated and Stable to PACU Complications: none  No foley catheter was placed.   Preoperative Note:   Risks of surgery discussed include: infection, bleeding, stroke, coma, death, paralysis, CSF leak, nerve/spinal cord injury, numbness, tingling, weakness, complex regional pain syndrome, recurrent stenosis and/or disc herniation, vascular injury, development of instability, neck/back pain, need for further surgery, persistent symptoms, development of deformity, and the risks of anesthesia. The patient understood these risks and agreed to proceed.  Operative Note:  Procedure:  1) Cervical Disc Arthroplasty at C5/6 using a LDR Mobi-C device   Procedure: After obtaining informed consent, the patient taken to the operating room, placed in supine position, general anesthesia induced.  The patient had a small shoulder roll placed behind the neck.  The patient received preop antibiotics and IV Decadron.  The patient had a neck incision outlined, was prepped and draped in usual sterile fashion. The incision was injected with local anesthetic.   An incision was opened, dissection taken down medial to the carotid artery and jugular vein, lateral to the trachea and esophagus.  The prevertebral fascia identified and a localizing x-ray demonstrated the correct level.  The longus colli were dissected laterally, and self-retaining retractors placed to open the operative field. The microscope was then brought into the field.  With this complete, distractor pins were placed in the vertebral bodies of C5 and C6. The distractor was placed, and the anulus at C5/6 was  opened using a bovie.  Curettes and pituitary rongeurs used to remove the majority of disk, then the drill was used to remove the posterior osteophyte and begin the foraminotomies. The nerve hook was used to elevate the posterior longitudinal ligament, which was then removed with Kerrison rongeurs. The microblunt nerve hook could be passed out the foramen bilaterally at each level.   Meticulous hemostasis was obtained.  A trial spacer was used to size the disc space. Using flouroscopic guidance, a 15 mm width x 13 mm depth x 5 mm height Mobi-C was then inserted in the prepared disc space.  The caspar distractor was removed, and bone wax used for hemostasis. Final AP and lateral radiographs were taken.   With the disc arthroplasty in good position, the wound was irrigated copiously with bacitracin-containing solution and meticulous hemostasis obtained.  Wound was closed in 2 layers using interrupted inverted 3-0 Vicryl sutures.  The wound was dressed with dermabond, the head of bed at 30 degrees, taken to recovery room in stable condition.  No new postop neurological deficits were identified.  Sponge and pattie counts were correct at the end of the procedure.     I performed the entire procedure with the assistance of Marin Olp PA as an Pensions consultant.  Meade Maw MD

## 2019-09-11 NOTE — Anesthesia Postprocedure Evaluation (Signed)
Anesthesia Post Note  Patient: [name]  Procedure(s) Performed: CERVICAL ANTERIOR DISC ARTHROPLASTY C5-6 (N/A )  Patient location during evaluation: PACU Anesthesia Type: General Level of consciousness: awake and alert Pain management: pain level controlled Vital Signs Assessment: post-procedure vital signs reviewed and stable Respiratory status: spontaneous breathing, nonlabored ventilation, respiratory function stable and patient connected to nasal cannula oxygen Cardiovascular status: blood pressure returned to baseline and stable Postop Assessment: no apparent nausea or vomiting Anesthetic complications: no     Last Vitals:  Vitals:   09/10/19 1248 09/10/19 1425  BP: 121/71 124/72  Pulse: 78 82  Resp: 16 16  Temp:  36.6 C  SpO2: 100% 100%    Last Pain:  Vitals:   09/11/19 0805  TempSrc:   PainSc: 0-No pain                 Precious Haws Obi Scrima

## 2021-03-08 IMAGING — RF DG C-ARM 1-60 MIN
1 series · 6 of 6 positions shown · non-contrast
Comparison: MRI cervical spine 08/15/2019.

CLINICAL DATA: Intraoperative imaging for C5-6 disc arthroplasty.

EXAM:
CERVICAL SPINE - 2-3 VIEW; DG C-ARM 1-60 MIN

[Series 1: dg x-ray · 0.20mm/px · 6 of 6 slices shown]
[im 1/6]
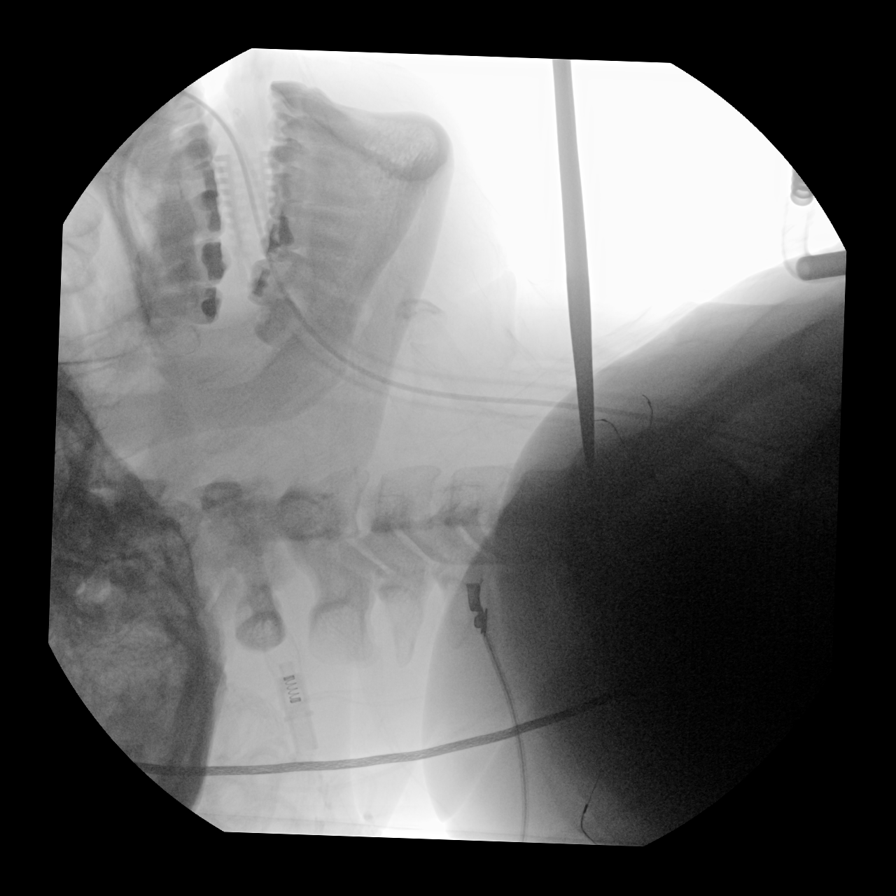
[im 2/6]
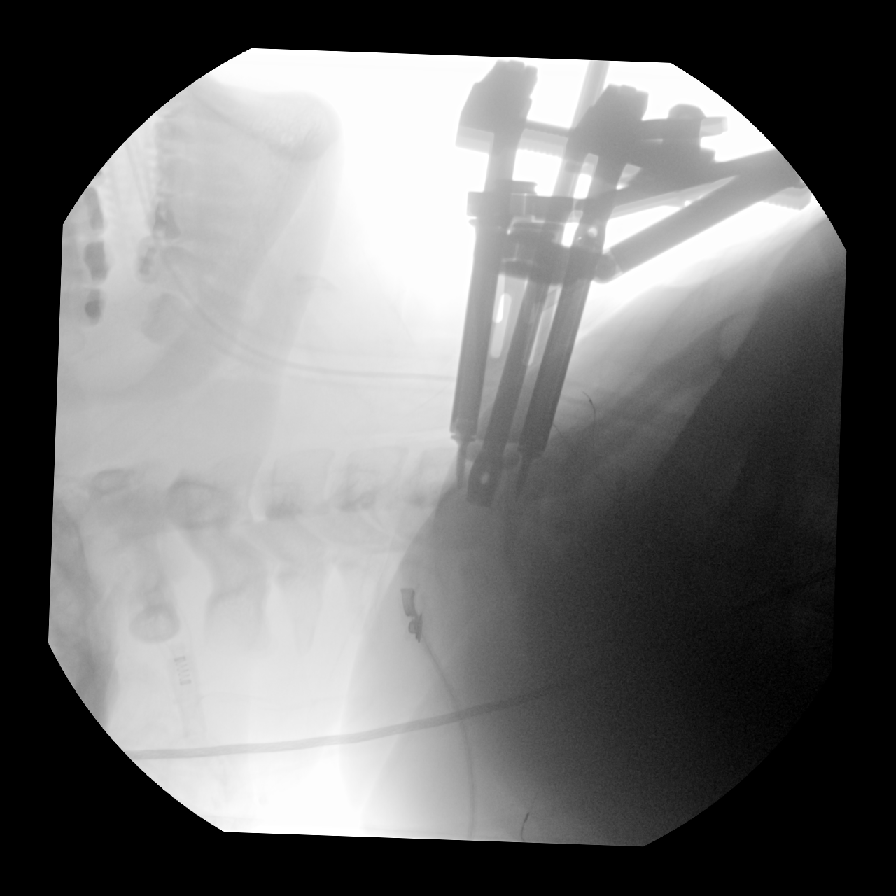
[im 3/6]
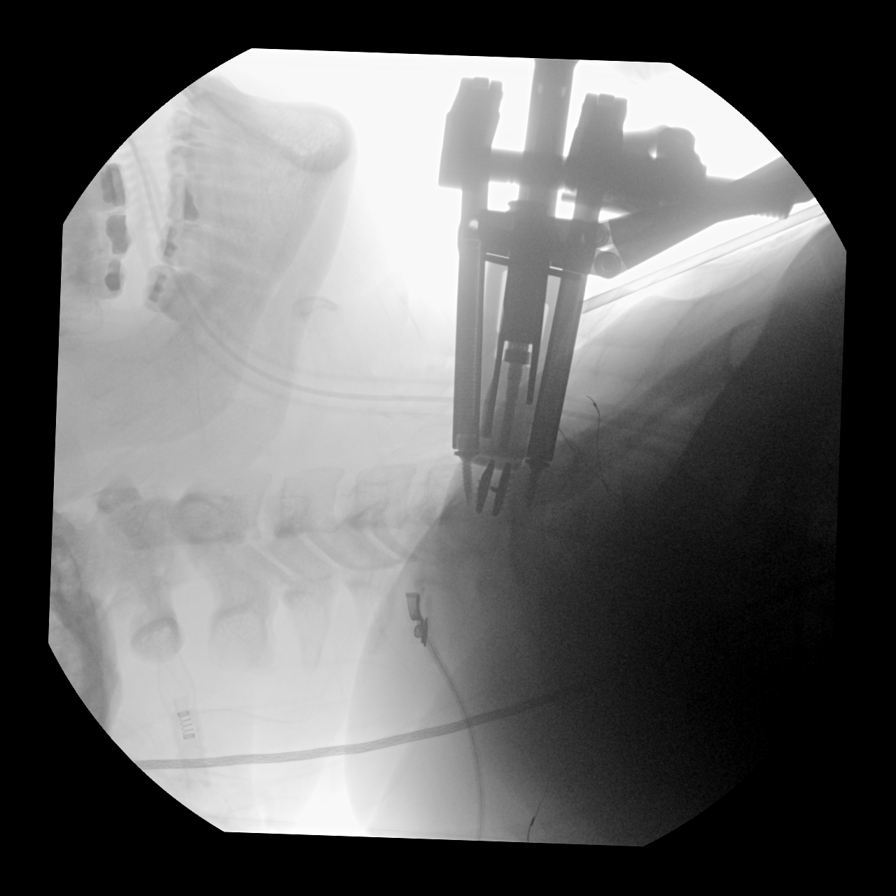
[im 4/6]
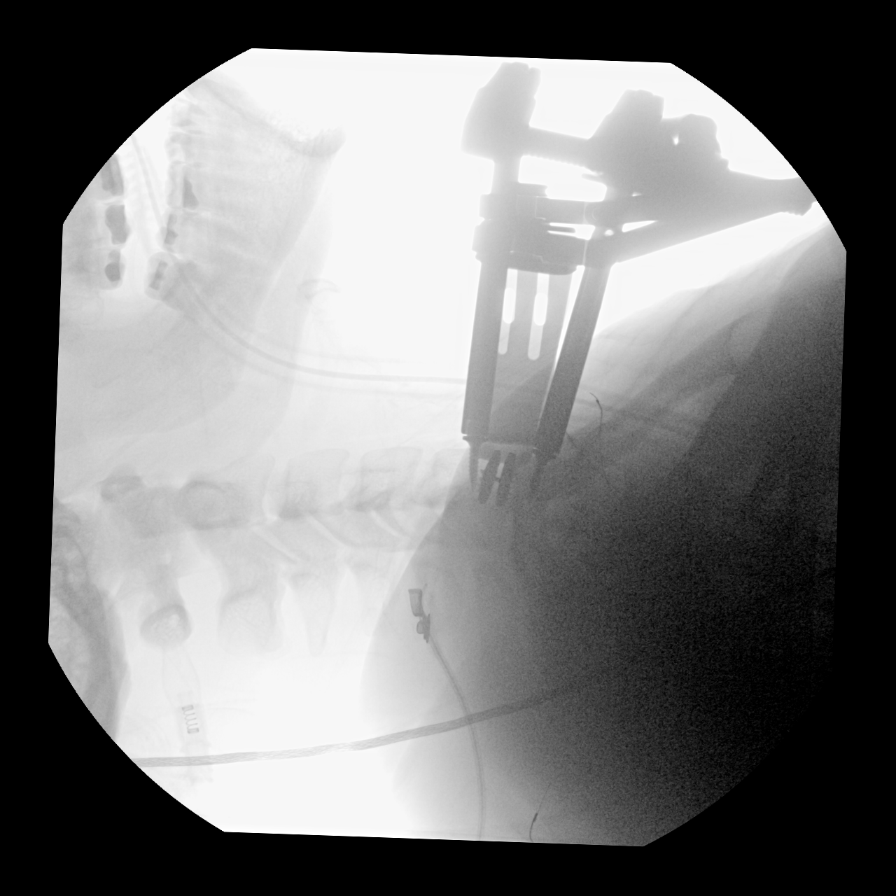
[im 5/6]
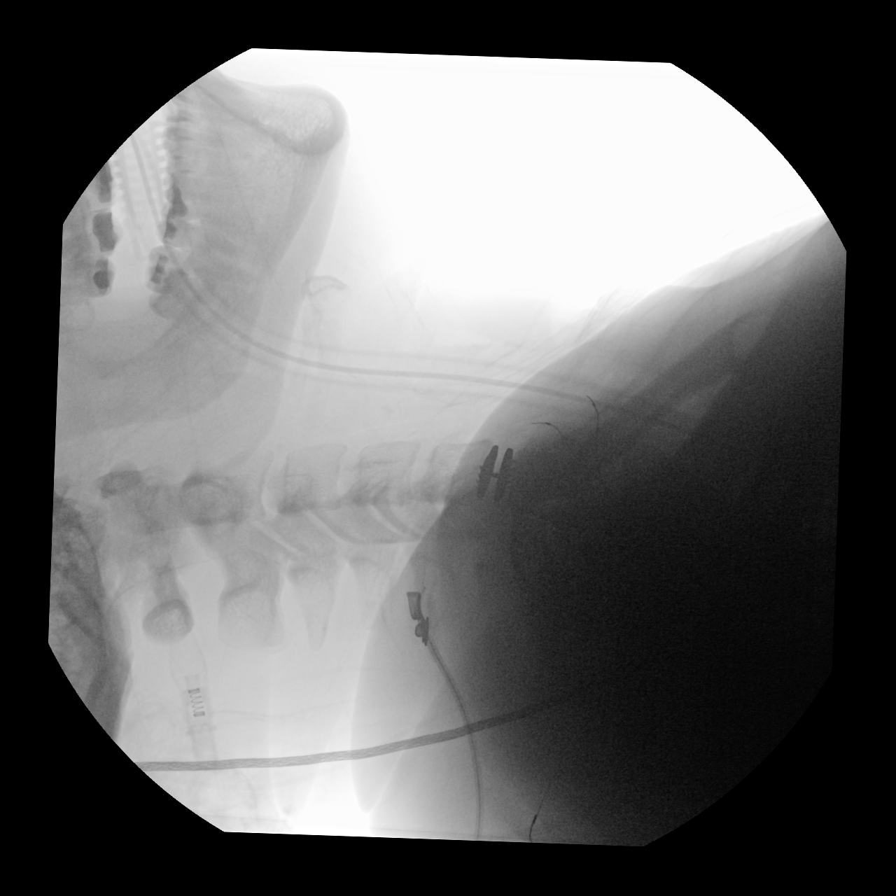
[im 6/6]
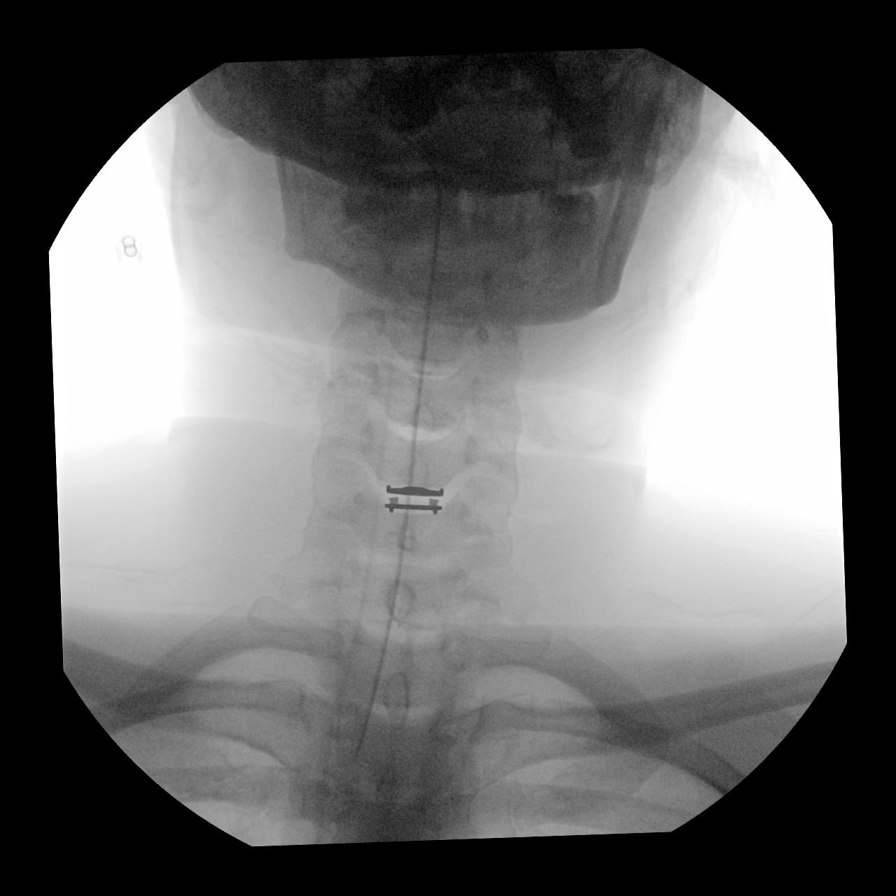

[6 of 6 positions shown; findings below may reference images not displayed]

FINDINGS: We are provided with 6 fluoroscopic spot views of the cervical
spine. Images demonstrate localization of the C5-6 level and
placement of a disc arthroplasty. No acute abnormality.
IMPRESSION: Intraoperative imaging for C5-6 disc arthroplasty.

## 2021-03-08 IMAGING — CR DG CERVICAL SPINE 2 OR 3 VIEWS
2 series · 2 of 2 positions shown · non-contrast
Comparison: Earlier same day

CLINICAL DATA: Follow-up cervical surgery.

EXAM:
CERVICAL SPINE - 2-3 VIEW

[c-spine lat]
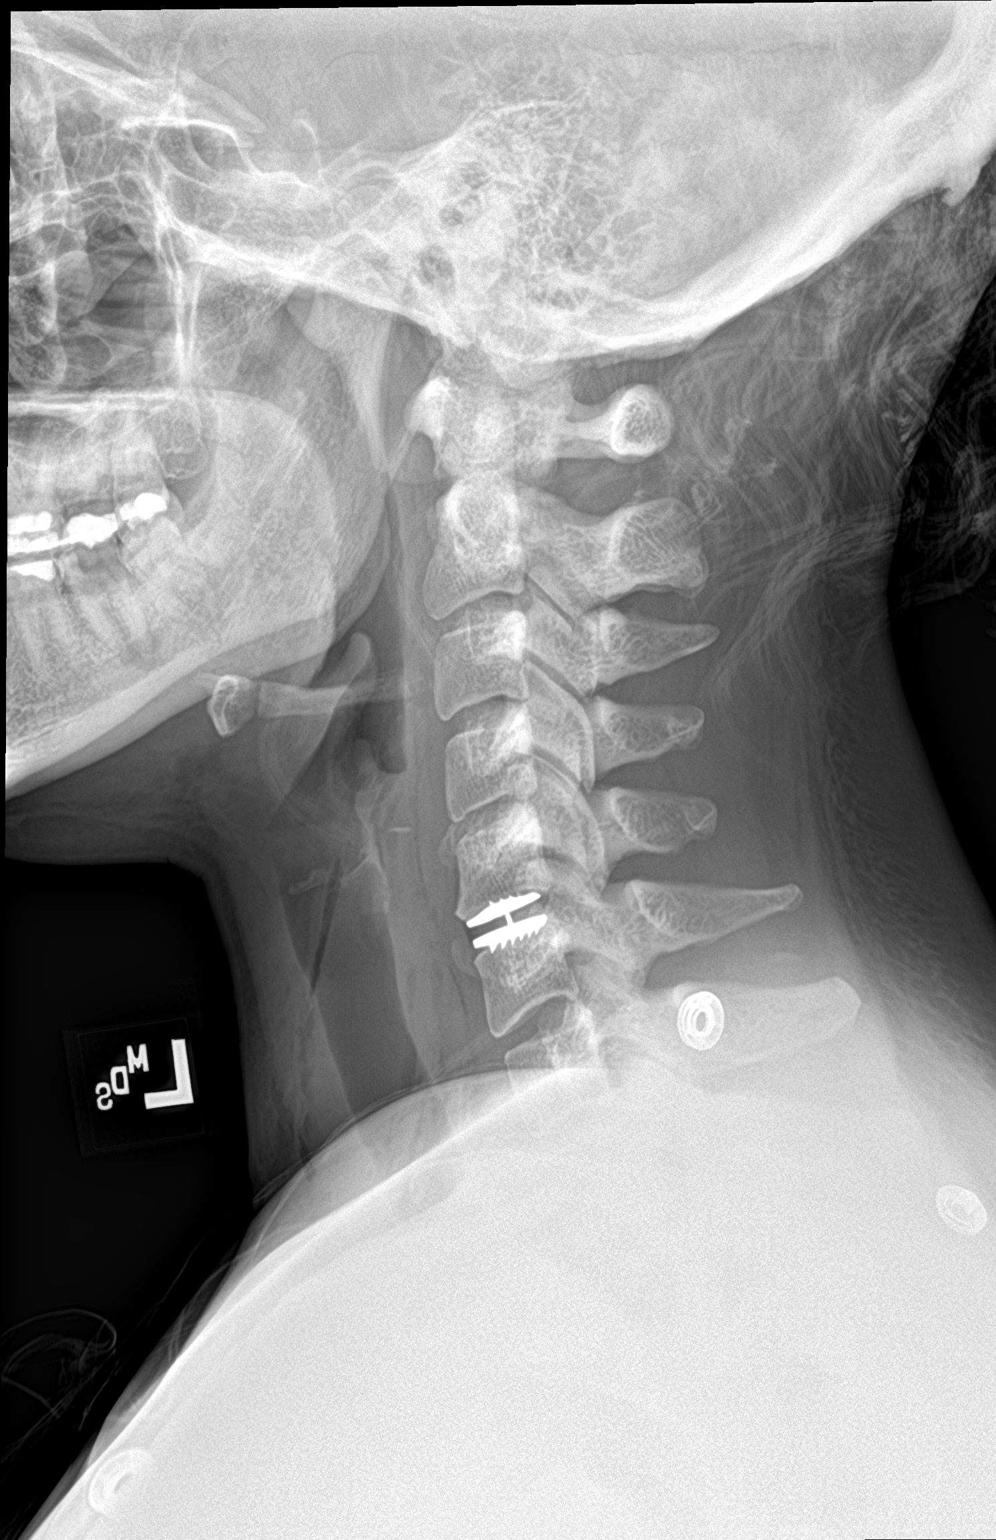

[c-spine ap]
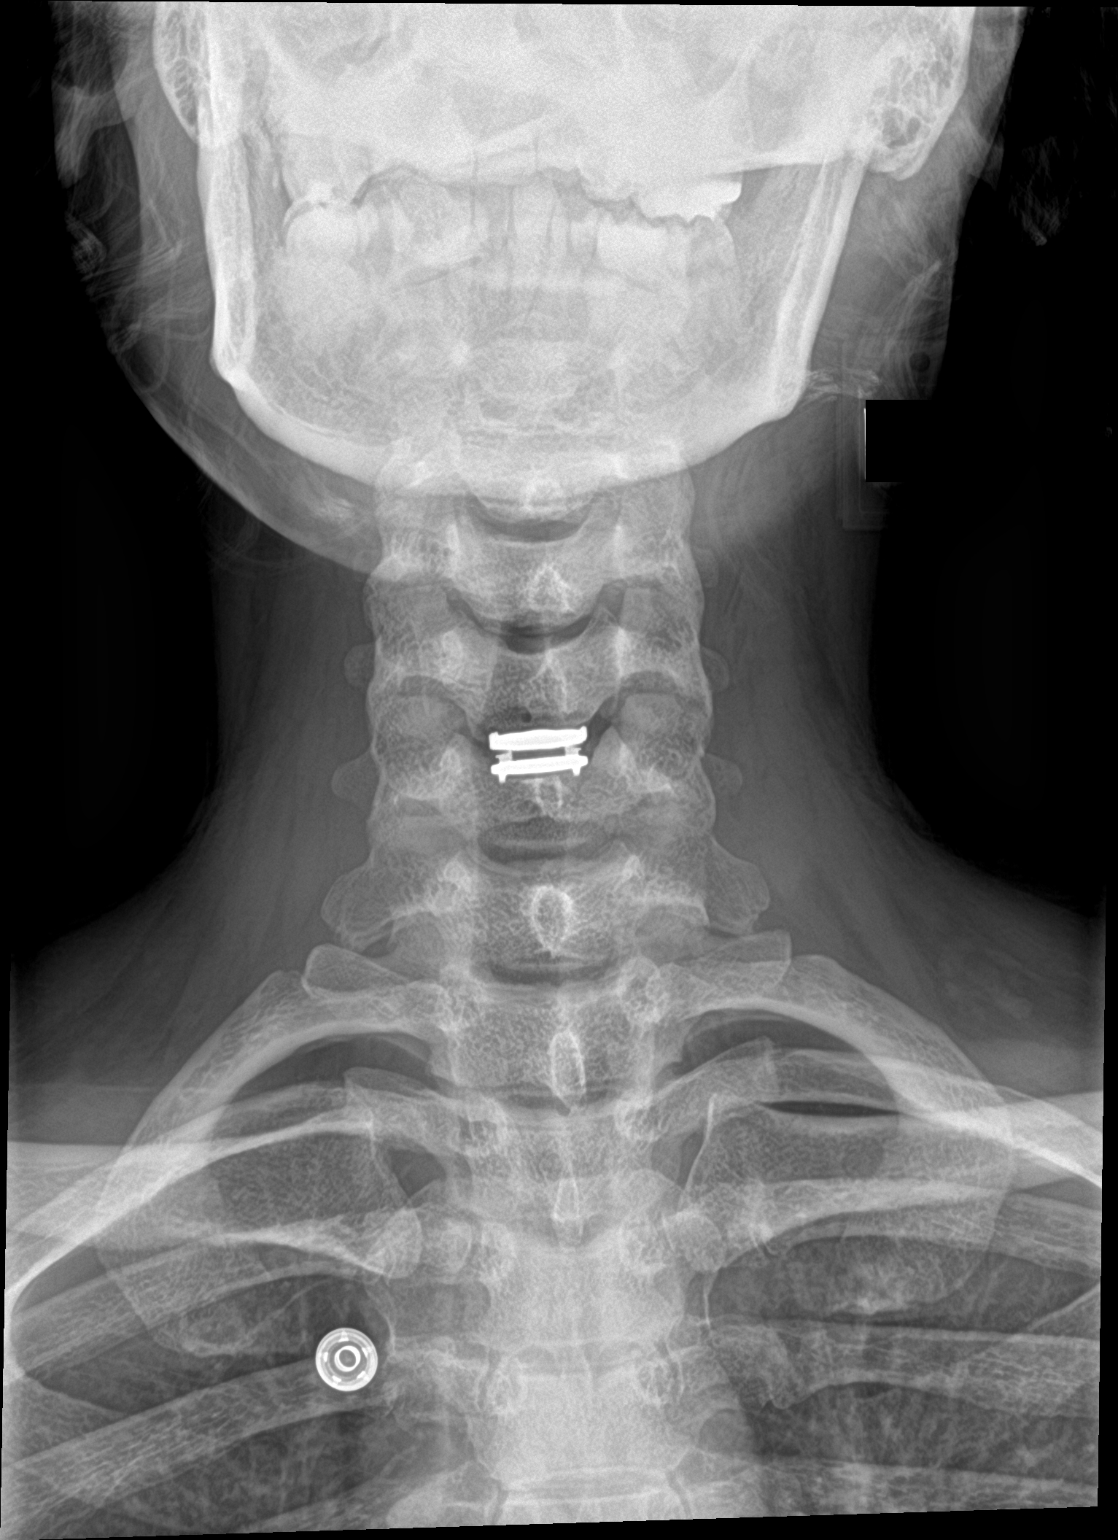

[2 of 2 positions shown; findings below may reference images not displayed]

FINDINGS: Disc arthroplasty at C5-6. Components appear well positioned. Mild
chronic disc space narrowing at C4-5. Expected mild soft tissue
swelling and air in the soft tissue planes.
IMPRESSION: Good appearance following disc arthroplasty at C5-6.

## 2021-03-08 IMAGING — RF DG CERVICAL SPINE 2 OR 3 VIEWS
1 series · 6 of 6 positions shown · non-contrast
Comparison: MRI cervical spine 08/15/2019.

CLINICAL DATA: Intraoperative imaging for C5-6 disc arthroplasty.

EXAM:
CERVICAL SPINE - 2-3 VIEW; DG C-ARM 1-60 MIN

[Series 1: dg x-ray · 0.20mm/px · 6 of 6 slices shown]
[im 1/6]
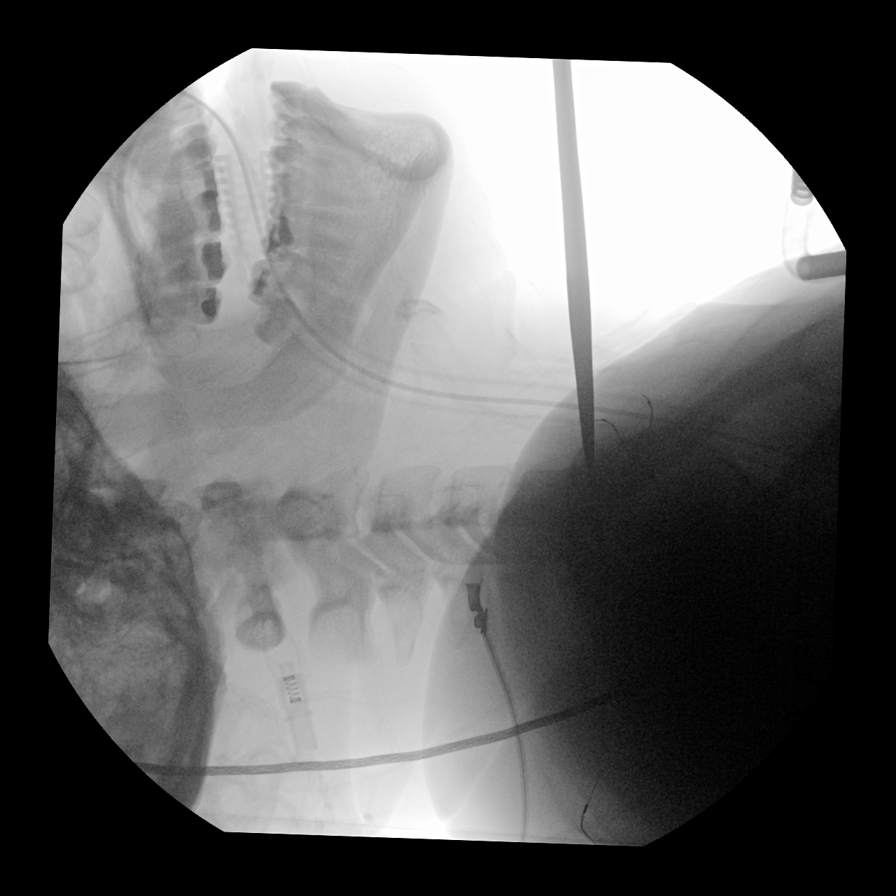
[im 2/6]
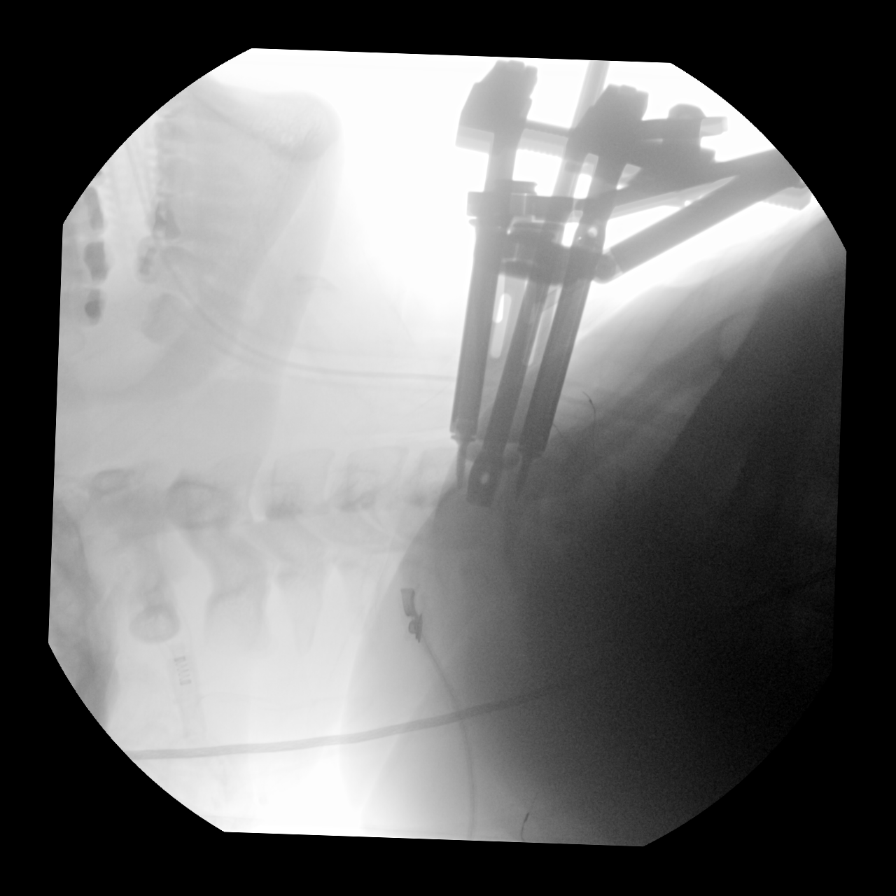
[im 3/6]
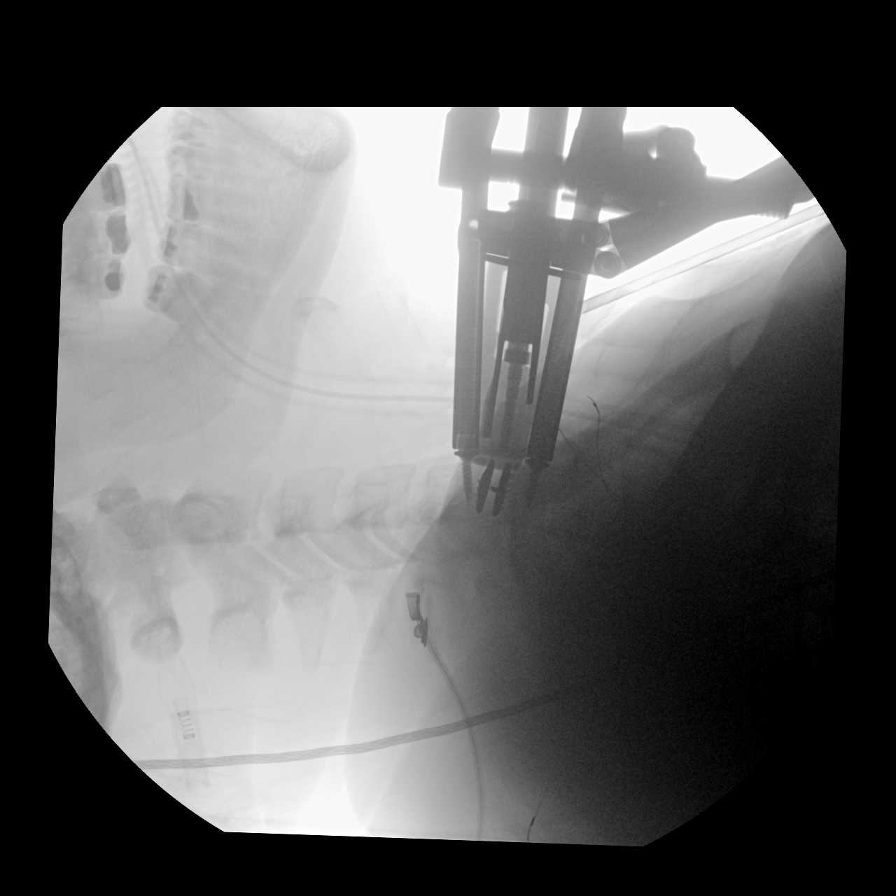
[im 4/6]
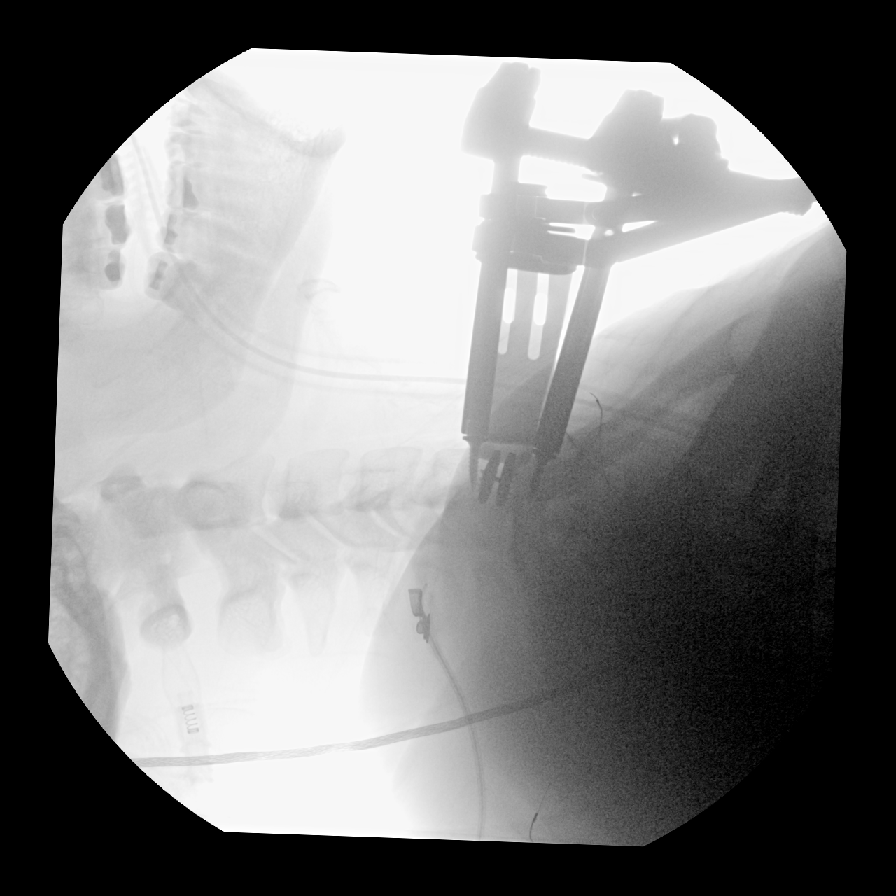
[im 5/6]
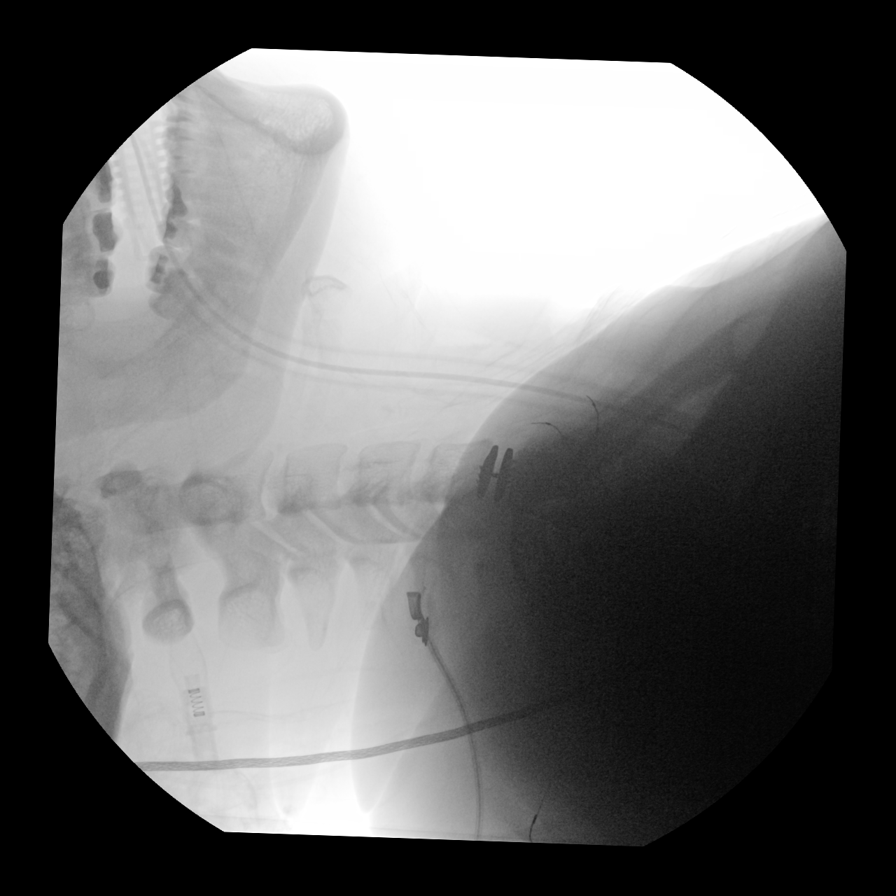
[im 6/6]
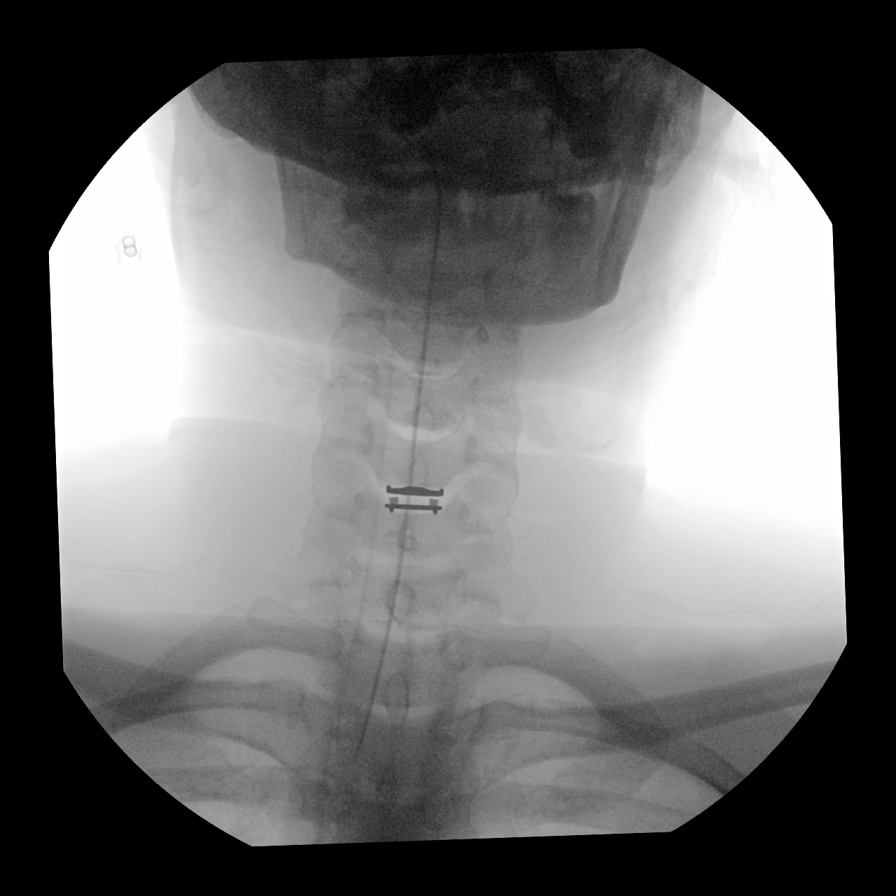

[6 of 6 positions shown; findings below may reference images not displayed]

FINDINGS: We are provided with 6 fluoroscopic spot views of the cervical
spine. Images demonstrate localization of the C5-6 level and
placement of a disc arthroplasty. No acute abnormality.
IMPRESSION: Intraoperative imaging for C5-6 disc arthroplasty.

## 2021-07-26 ENCOUNTER — Other Ambulatory Visit: Payer: Self-pay | Admitting: Neurology

## 2021-07-26 DIAGNOSIS — G35 Multiple sclerosis: Secondary | ICD-10-CM

## 2023-01-16 ENCOUNTER — Other Ambulatory Visit: Payer: Self-pay | Admitting: Obstetrics and Gynecology

## 2023-01-16 DIAGNOSIS — Z1231 Encounter for screening mammogram for malignant neoplasm of breast: Secondary | ICD-10-CM

## 2023-01-30 ENCOUNTER — Encounter: Payer: Self-pay | Admitting: Oncology

## 2023-03-07 ENCOUNTER — Encounter: Payer: Self-pay | Admitting: Oncology

## 2023-03-14 ENCOUNTER — Ambulatory Visit
Admission: RE | Admit: 2023-03-14 | Discharge: 2023-03-14 | Disposition: A | Discharge status: Home / Self Care | Payer: Managed Care, Other (non HMO) | Source: Ambulatory Visit | Attending: Obstetrics and Gynecology | Admitting: Obstetrics and Gynecology

## 2023-03-14 DIAGNOSIS — Z1231 Encounter for screening mammogram for malignant neoplasm of breast: Secondary | ICD-10-CM | POA: Diagnosis present

## 2023-03-15 ENCOUNTER — Inpatient Hospital Stay
Admission: RE | Admit: 2023-03-15 | Discharge: 2023-03-15 | Disposition: A | Discharge status: Home / Self Care | Payer: Self-pay | Source: Ambulatory Visit | Attending: Obstetrics and Gynecology | Admitting: Obstetrics and Gynecology

## 2023-03-15 ENCOUNTER — Other Ambulatory Visit: Payer: Self-pay | Admitting: *Deleted

## 2023-03-15 DIAGNOSIS — Z1231 Encounter for screening mammogram for malignant neoplasm of breast: Secondary | ICD-10-CM

## 2023-06-22 ENCOUNTER — Encounter: Payer: Self-pay | Admitting: Oncology

## 2023-06-22 DIAGNOSIS — S81011A Laceration without foreign body, right knee, initial encounter: Secondary | ICD-10-CM | POA: Insufficient documentation

## 2023-06-22 DIAGNOSIS — Z23 Encounter for immunization: Secondary | ICD-10-CM | POA: Diagnosis not present

## 2023-06-22 DIAGNOSIS — S8991XA Unspecified injury of right lower leg, initial encounter: Secondary | ICD-10-CM | POA: Diagnosis present

## 2023-06-23 ENCOUNTER — Emergency Department
Admission: EM | Admit: 2023-06-23 | Discharge: 2023-06-23 | Disposition: A | Discharge status: Home / Self Care | Payer: Medicaid Other | Attending: Emergency Medicine | Admitting: Emergency Medicine

## 2023-06-23 ENCOUNTER — Other Ambulatory Visit: Payer: Self-pay

## 2023-06-23 ENCOUNTER — Emergency Department: Payer: Medicaid Other

## 2023-06-23 DIAGNOSIS — S81011A Laceration without foreign body, right knee, initial encounter: Secondary | ICD-10-CM

## 2023-06-23 MED ORDER — TETANUS-DIPHTH-ACELL PERTUSSIS 5-2.5-18.5 LF-MCG/0.5 IM SUSY
0.5000 mL | PREFILLED_SYRINGE | Freq: Once | INTRAMUSCULAR | Status: AC
  Administered 2023-06-23: 0.5 mL via INTRAMUSCULAR
  Filled 2023-06-23: qty 0.5

## 2023-06-23 MED ORDER — LIDOCAINE-EPINEPHRINE 2 %-1:100000 IJ SOLN
20.0000 mL | Freq: Once | INTRAMUSCULAR | Status: AC
  Administered 2023-06-23: 20 mL
  Filled 2023-06-23: qty 1
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
## 2023-06-23 NOTE — ED Notes (Signed)
Pt also has laceration to the right knee.
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
## 2023-08-01 ENCOUNTER — Encounter: Payer: Self-pay | Admitting: Neurology

## 2023-08-01 ENCOUNTER — Other Ambulatory Visit: Payer: Self-pay | Admitting: Neurology

## 2023-08-01 DIAGNOSIS — G35 Multiple sclerosis: Secondary | ICD-10-CM

## 2023-09-11 ENCOUNTER — Encounter: Payer: Self-pay | Admitting: Oncology

## 2023-10-02 ENCOUNTER — Encounter: Payer: Self-pay | Admitting: Neurology

## 2023-10-02 ENCOUNTER — Encounter: Payer: Self-pay | Admitting: Oncology

## 2023-10-04 ENCOUNTER — Other Ambulatory Visit

## 2023-10-31 ENCOUNTER — Other Ambulatory Visit: Payer: Self-pay | Admitting: Neurology

## 2023-10-31 DIAGNOSIS — G35 Multiple sclerosis: Secondary | ICD-10-CM

## 2023-12-10 ENCOUNTER — Ambulatory Visit
Admission: RE | Admit: 2023-12-10 | Discharge: 2023-12-10 | Disposition: A | Discharge status: Home / Self Care | Source: Ambulatory Visit | Attending: Neurology | Admitting: Neurology

## 2023-12-10 DIAGNOSIS — G35 Multiple sclerosis: Secondary | ICD-10-CM | POA: Insufficient documentation

## 2023-12-10 MED ORDER — GADOBUTROL 1 MMOL/ML IV SOLN
9.0000 mL | Freq: Once | INTRAVENOUS | Status: AC | PRN
  Administered 2023-12-10: 9 mL via INTRAVENOUS

## 2024-02-13 ENCOUNTER — Other Ambulatory Visit: Payer: Self-pay | Admitting: Obstetrics and Gynecology

## 2024-02-13 DIAGNOSIS — Z1231 Encounter for screening mammogram for malignant neoplasm of breast: Secondary | ICD-10-CM

## 2024-05-12 ENCOUNTER — Encounter: Payer: Self-pay | Admitting: Oncology

## 2024-05-12 ENCOUNTER — Ambulatory Visit
Admission: RE | Admit: 2024-05-12 | Discharge: 2024-05-12 | Disposition: A | Source: Ambulatory Visit | Attending: Obstetrics and Gynecology | Admitting: Obstetrics and Gynecology

## 2024-05-12 DIAGNOSIS — Z1231 Encounter for screening mammogram for malignant neoplasm of breast: Secondary | ICD-10-CM
